# Patient Record
Sex: Female | Born: 1990 | Hispanic: Yes | Marital: Married | Smoking: Never smoker
Health system: Southern US, Community
[De-identification: ages and names within clinical notes are randomized; demographics above are authoritative.]

## PROBLEM LIST (undated history)

## (undated) ENCOUNTER — Inpatient Hospital Stay (HOSPITAL_COMMUNITY): Payer: Self-pay

## (undated) DIAGNOSIS — Z8619 Personal history of other infectious and parasitic diseases: Secondary | ICD-10-CM

## (undated) HISTORY — PX: NO PAST SURGERIES: SHX2092

## (undated) HISTORY — DX: Personal history of other infectious and parasitic diseases: Z86.19

---
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
## 2013-11-22 ENCOUNTER — Encounter (HOSPITAL_COMMUNITY): Payer: Self-pay | Admitting: *Deleted

## 2015-01-22 NOTE — L&D Delivery Note (Signed)
Delivery Note At 11:40 AM a viable female was delivered via Vaginal, Spontaneous Delivery (Presentation:OA ;restituted to ROA  ).  APGAR: 9, 9; weight pending  .   Placenta status:intact ,delivered via Tomasa BlaseSchultz .  Cord: three vessels  with the following complications:none.  Cord pH:N/A  Anesthesia: epidural  Episiotomy: None Lacerations: None Suture Repair: N/A Est. Blood Loss (mL): 100  Mom to postpartum.  Baby to Couplet care / Skin to Skin.  Clayton BiblesSamantha Weinhold, SNM 01/05/2016, 11:57 AM  Patient is a [id number] at 5515w5d who was admitted w/ SROM/labor, uncomplicated prenatal course.  She progressed without augmentation.  I was gloved and present for delivery in its entirety.  Second stage of labor progressed, baby delivered after less than 30 mins of contractions.  Mild decels during second stage noted.  Complications: none  Lacerations: none  EBL: 100cc  SHAW, KIMBERLY, CNM 12:19 PM  01/05/2016

## 2015-04-23 ENCOUNTER — Encounter (HOSPITAL_COMMUNITY): Payer: Self-pay

## 2015-04-23 ENCOUNTER — Emergency Department (HOSPITAL_COMMUNITY)
Admission: EM | Admit: 2015-04-23 | Discharge: 2015-04-23 | Disposition: A | Discharge status: Home / Self Care | Payer: Medicaid Other | Attending: Emergency Medicine | Admitting: Emergency Medicine

## 2015-04-23 DIAGNOSIS — B9789 Other viral agents as the cause of diseases classified elsewhere: Secondary | ICD-10-CM

## 2015-04-23 DIAGNOSIS — J069 Acute upper respiratory infection, unspecified: Secondary | ICD-10-CM | POA: Insufficient documentation

## 2015-04-23 MED ORDER — GUAIFENESIN 100 MG/5ML PO LIQD: 100.0000 mg | ORAL | Status: DC | PRN

## 2015-04-23 MED ORDER — BENZONATATE 100 MG PO CAPS: 100.0000 mg | ORAL_CAPSULE | Freq: Three times a day (TID) | ORAL | Status: DC

## 2015-04-23 NOTE — Discharge Instructions (Signed)
Viral Infections °A viral infection can be caused by different types of viruses. Most viral infections are not serious and resolve on their own. However, some infections may cause severe symptoms and may lead to further complications. °SYMPTOMS °Viruses can frequently cause: °· Minor sore throat. °· Aches and pains. °· Headaches. °· Runny nose. °· Different types of rashes. °· Watery eyes. °· Tiredness. °· Cough. °· Loss of appetite. °· Gastrointestinal infections, resulting in nausea, vomiting, and diarrhea. °These symptoms do not respond to antibiotics because the infection is not caused by bacteria. However, you might catch a bacterial infection following the viral infection. This is sometimes called a "superinfection." Symptoms of such a bacterial infection may include: °· Worsening sore throat with pus and difficulty swallowing. °· Swollen neck glands. °· Chills and a high or persistent fever. °· Severe headache. °· Tenderness over the sinuses. °· Persistent overall ill feeling (malaise), muscle aches, and tiredness (fatigue). °· Persistent cough. °· Yellow, green, or brown mucus production with coughing. °HOME CARE INSTRUCTIONS  °· Only take over-the-counter or prescription medicines for pain, discomfort, diarrhea, or fever as directed by your caregiver. °· Drink enough water and fluids to keep your urine clear or pale yellow. Sports drinks can provide valuable electrolytes, sugars, and hydration. °· Get plenty of rest and maintain proper nutrition. Soups and broths with crackers or rice are fine. °SEEK IMMEDIATE MEDICAL CARE IF:  °· You have severe headaches, shortness of breath, chest pain, neck pain, or an unusual rash. °· You have uncontrolled vomiting, diarrhea, or you are unable to keep down fluids. °· You or your child has an oral temperature above 102° F (38.9° C), not controlled by medicine. °· Your baby is older than 3 months with a rectal temperature of 102° F (38.9° C) or higher. °· Your baby is 3  months old or younger with a rectal temperature of 100.4° F (38° C) or higher. °MAKE SURE YOU:  °· Understand these instructions. °· Will watch your condition. °· Will get help right away if you are not doing well or get worse. °  °This information is not intended to replace advice given to you by your health care provider. Make sure you discuss any questions you have with your health care provider. °  °Document Released: 10/17/2004 Document Revised: 04/01/2011 Document Reviewed: 06/15/2014 °Elsevier Interactive Patient Education ©2016 [employer] ° °

## 2015-04-23 NOTE — ED Provider Notes (Signed)
CSN: [id number]     Arrival date & time 04/23/15  1225 History   First MD Initiated Contact with Patient 04/23/15 1320     Chief Complaint  Patient presents with  . Cough     (Consider location/radiation/quality/duration/timing/severity/associated sxs/prior Treatment) HPI   25 year old female presents with cold symptoms. Patient report for the past 2 weeks she has had fever, chills, headache, nasal congestion, popping sounds in the ears, runny nose, sore throat, dry cough, decrease in appetite. Although this symptom is improving, her cough still persist. Her son is now having this same cough as well. She did not have a flu shot and pneumonia shot this year. She is a nonsmoker and no history of COPD or asthma.  Past Medical History  Diagnosis Date  . Preterm labor   . Chlamydia infection, current pregnancy     partner not treated   Past Surgical History  Procedure Laterality Date  . No past surgeries     Family History  Problem Relation Age of Onset  . Asthma Sister    Social History  Substance Use Topics  . Smoking status: Never Smoker   . Smokeless tobacco: Never Used  . Alcohol Use: No   OB History    Gravida Para Term Preterm AB TAB SAB Ectopic Multiple Living   0 0 0 0 0 1     Review of Systems  All other systems reviewed and are negative.     Allergies  Review of patient's allergies indicates no known allergies.  Home Medications   Prior to Admission medications   Not on File   BP 113/74 mmHg  Pulse 94  Temp(Src) 98 F (36.7 C) (Oral)  Resp 16  Wt 56.155 kg  SpO2 97%  LMP 04/16/2015 Physical Exam  Constitutional: She is oriented to person, place, and time. She appears well-developed and well-nourished. No distress.  Hispanic female, well appearing in no acute distress. She has persistent cough.  HENT:  Head: Atraumatic.  Ears: TMs are dull but non erythematous and no signs of perforation Nose: Rhinorrhea Throat: Uvula is midline no  tonsillar enlargement or exudates, no trismus  Eyes: Conjunctivae are normal.  Neck: Normal range of motion. Neck supple.  No nuchal rigidity  Cardiovascular: Normal rate and regular rhythm.   Pulmonary/Chest: Effort normal and breath sounds normal. No respiratory distress. She has no wheezes. She has no rales. She exhibits no tenderness.  Abdominal: Soft. There is no tenderness.  Lymphadenopathy:    She has no cervical adenopathy.  Neurological: She is alert and oriented to person, place, and time.  Skin: No rash noted.  Psychiatric: She has a normal mood and affect.  Nursing note and vitals reviewed.   ED Course  Procedures (including critical care time) Labs Review Labs Reviewed - No data to display  Imaging Review No results found. I have personally reviewed and evaluated these images and lab results as part of my medical decision-making.   EKG Interpretation None      MDM   Final diagnoses:  Viral upper respiratory tract infection with cough    BP 113/74 mmHg  Pulse 94  Temp(Src) 98 F (36.7 C) (Oral)  Resp 16  Wt 56.155 kg  SpO2 97%  LMP 04/16/2015   1:31 PM Patient symptoms consistence with moderate risk for infection. Her lungs are clear on exam. No hypoxia. She is afebrile. Low suspicion for pneumonia. Symptomatic treatment provided. Return precaution discussed.   Fayrene Helper, PA-C  04/23/15 1333  Richardean Canalavid H Yao, MD 04/23/15 1655

## 2015-04-23 NOTE — ED Notes (Signed)
Pt here with c/o dry cough, onset one week ago associated with "popping sound" when she blows her nose. She also reports runny nose.

## 2015-06-05 ENCOUNTER — Ambulatory Visit (INDEPENDENT_AMBULATORY_CARE_PROVIDER_SITE_OTHER): Payer: Medicaid Other | Admitting: *Deleted

## 2015-06-05 DIAGNOSIS — N898 Other specified noninflammatory disorders of vagina: Secondary | ICD-10-CM

## 2015-06-05 DIAGNOSIS — Z3201 Encounter for pregnancy test, result positive: Secondary | ICD-10-CM

## 2015-06-05 DIAGNOSIS — O26891 Other specified pregnancy related conditions, first trimester: Secondary | ICD-10-CM

## 2015-06-05 DIAGNOSIS — Z113 Encounter for screening for infections with a predominantly sexual mode of transmission: Secondary | ICD-10-CM

## 2015-06-05 DIAGNOSIS — Z3200 Encounter for pregnancy test, result unknown: Secondary | ICD-10-CM

## 2015-06-05 LAB — POCT PREGNANCY, URINE: PREG TEST UR: POSITIVE — AB

## 2015-06-05 MED ORDER — TERCONAZOLE 0.4 % VA CREA: 1.0000 | TOPICAL_CREAM | Freq: Every day | VAGINAL | Status: DC

## 2015-06-05 NOTE — Progress Notes (Signed)
Patient here for pregnancy test. States has regular periods- LMP 04/16/15 which makes her 3523w1d. Would like to get prenatal care here- wants to get blood work at first visit.  Also states has been having vaginal itching and white or yellow discharge- took vagisil with little relief. Wants medicine for yeast which I have sent to pharmacy. Also states Was recently treated for chlamydia but doesn't think partner has been treated. Will send urine for testing for gc/chlamydia. I explained it cream does not help vaginal iching- and if  Gc/chlamydia are negative - but still having itching or discomfort that worsens before initial ob visit- call for appt. She voices understanding.

## 2015-06-06 LAB — GC/CHLAMYDIA PROBE AMP (~~LOC~~) NOT AT ARMC
CHLAMYDIA, DNA PROBE: NEGATIVE
NEISSERIA GONORRHEA: NEGATIVE

## 2015-07-06 ENCOUNTER — Encounter: Payer: Self-pay | Admitting: Obstetrics and Gynecology

## 2015-07-27 ENCOUNTER — Encounter: Payer: Self-pay | Admitting: Family Medicine

## 2015-08-15 ENCOUNTER — Other Ambulatory Visit (HOSPITAL_COMMUNITY): Payer: Self-pay | Admitting: Student

## 2015-08-15 ENCOUNTER — Encounter: Payer: Self-pay | Admitting: Student

## 2015-08-15 ENCOUNTER — Ambulatory Visit (INDEPENDENT_AMBULATORY_CARE_PROVIDER_SITE_OTHER): Payer: Self-pay | Admitting: Student

## 2015-08-15 VITALS — BP 110/60 | HR 74 | Wt 134.0 lb

## 2015-08-15 DIAGNOSIS — Z8751 Personal history of pre-term labor: Secondary | ICD-10-CM

## 2015-08-15 DIAGNOSIS — O0993 Supervision of high risk pregnancy, unspecified, third trimester: Secondary | ICD-10-CM | POA: Insufficient documentation

## 2015-08-15 DIAGNOSIS — Z113 Encounter for screening for infections with a predominantly sexual mode of transmission: Secondary | ICD-10-CM

## 2015-08-15 DIAGNOSIS — Z3482 Encounter for supervision of other normal pregnancy, second trimester: Secondary | ICD-10-CM

## 2015-08-15 DIAGNOSIS — Z363 Encounter for antenatal screening for malformations: Secondary | ICD-10-CM

## 2015-08-15 DIAGNOSIS — Z3492 Encounter for supervision of normal pregnancy, unspecified, second trimester: Secondary | ICD-10-CM

## 2015-08-15 NOTE — Patient Instructions (Signed)

## 2015-08-16 LAB — PRENATAL PROFILE (SOLSTAS)
Antibody Screen: NEGATIVE
Basophils Absolute: 0 cells/uL (ref 0–200)
Basophils Relative: 0 %
Eosinophils Absolute: 0 cells/uL — ABNORMAL LOW (ref 15–500)
Eosinophils Relative: 0 %
HEMATOCRIT: 37.9 % (ref 35.0–45.0)
HEMOGLOBIN: 13.1 g/dL (ref 11.7–15.5)
HEP B S AG: NEGATIVE
HIV: NONREACTIVE
LYMPHS ABS: 2210 {cells}/uL (ref 850–3900)
Lymphocytes Relative: 26 %
MCH: 28.7 pg (ref 27.0–33.0)
MCHC: 34.6 g/dL (ref 32.0–36.0)
MCV: 83.1 fL (ref 80.0–100.0)
MONO ABS: 425 {cells}/uL (ref 200–950)
MPV: 10.8 fL (ref 7.5–12.5)
Monocytes Relative: 5 %
NEUTROS ABS: 5865 {cells}/uL (ref 1500–7800)
NEUTROS PCT: 69 %
Platelets: 231 10*3/uL (ref 140–400)
RBC: 4.56 MIL/uL (ref 3.80–5.10)
RDW: 14.5 % (ref 11.0–15.0)
Rh Type: POSITIVE
Rubella: 1.09 Index — ABNORMAL HIGH (ref <0.90)
WBC: 8.5 10*3/uL (ref 3.8–10.8)

## 2015-08-16 LAB — WET PREP, GENITAL: Trich, Wet Prep: NONE SEEN

## 2015-08-16 NOTE — Progress Notes (Signed)
  Subjective:    Lorraine Shelton is a [id number] [redacted]w[redacted]d being seen today for her first obstetrical visit.  Her obstetrical history is significant for preterm delivery, first baby born at 54 weeks (subsequent pregnancy TSVD). Patient does intend to breast feed. Pregnancy history fully reviewed.  Patient reports no complaints.  Vitals:   08/15/15 1302  BP: 110/60  Pulse: 74  Weight: 134 lb (60.8 kg)    HISTORY: OB History  Gravida Para Term Preterm AB Living  3 2 1 1  0 2  SAB TAB Ectopic Multiple Live Births  0 0 0 0 2    # Outcome Date GA Lbr Len/2nd Weight Sex Delivery Anes PTL Lv  3 Current           2 Term 12/04/10 [redacted]w[redacted]d 04:58 / 00:17 7 lb 1.8 oz (3.226 kg) M Vag-Spont EPI       Birth Comments: WNL  1 Preterm 07/05/09 [redacted]w[redacted]d  4 lb (1.814 kg) M Vag-Spont EPI N LIV     Birth Comments: PProm     Past Medical History:  Diagnosis Date  . History of chlamydia 2012  . Preterm labor    Past Surgical History:  Procedure Laterality Date  . NO PAST SURGERIES     Family History  Problem Relation Age of Onset  . Asthma Brother      Exam    Uterus:  Fundal Height: 17 cm  Pelvic Exam:    Perineum: No Hemorrhoids, Normal Perineum   Vulva: normal   Vagina:  normal mucosa, normal discharge   Cervix: no cervical motion tenderness and cervix closed   Adnexa: normal adnexa   Skin: normal coloration and turgor, no rashes    Neurologic: oriented, normal mood   Extremities: normal strength, tone, and muscle mass   HEENT neck supple with midline trachea and thyroid without masses   Mouth/Teeth dental hygiene good   Neck supple and no masses   Cardiovascular: regular rate and rhythm, no murmurs or gallops   Respiratory:  appears well, vitals normal, no respiratory distress, acyanotic, normal RR, chest clear, no wheezing, crepitations, rhonchi, normal symmetric air entry   Abdomen: soft, non-tender; bowel sounds normal; no masses,  no organomegaly      Assessment:    Pregnancy:  [id number] Patient Active Problem List   Diagnosis Date Noted  . Supervision of normal pregnancy in second trimester 08/15/2015  . Heart murmur previously undiagnosed 09/05/2010  . History of preterm labor 09/05/2010        Plan:  1. Supervision of normal pregnancy in second trimester  - Prenatal Profile - Pain Mgmt, Profile 6 Conf w/o mM, U - Hemoglobinopathy evaluation - Culture, OB Urine - [place] MFM OB COMP + 14 WK; Future - GC/Chlamydia probe amp ([place])not at [facility] - Wet prep, genital  2. History of preterm labor -Discussed risks & benefits of 17-P, pt undecided  3. Encounter for antenatal screening for malformation using ultrasound - [place] MFM OB COMP + 14 WK; Future    Initial labs drawn. Prenatal vitamins. Problem list reviewed and updated. Genetic Screening discussed Quad Screen: declined.  Ultrasound discussed; fetal survey: ordered.  Follow up in 4 weeks.   Judeth Horn 08/16/2015

## 2015-08-17 LAB — GC/CHLAMYDIA PROBE AMP (~~LOC~~) NOT AT ARMC
Chlamydia: NEGATIVE
NEISSERIA GONORRHEA: NEGATIVE

## 2015-08-17 LAB — CULTURE, OB URINE
Colony Count: NO GROWTH
ORGANISM ID, BACTERIA: NO GROWTH

## 2015-08-21 ENCOUNTER — Encounter (HOSPITAL_COMMUNITY): Payer: Self-pay | Admitting: Student

## 2015-08-24 LAB — PAIN MGMT, PROFILE 6 CONF W/O MM, U
6 ACETYLMORPHINE: NEGATIVE ng/mL (ref <10)
ALPHAHYDROXYALPRAZOLAM: NEGATIVE ng/mL (ref <25)
AMINOCLONAZEPAM: NEGATIVE ng/mL (ref <25)
AMPHETAMINES: NEGATIVE ng/mL (ref <500)
Alcohol Metabolites: NEGATIVE ng/mL (ref <500)
Alphahydroxymidazolam: NEGATIVE ng/mL (ref <50)
Alphahydroxytriazolam: NEGATIVE ng/mL (ref <50)
BENZODIAZEPINES: POSITIVE ng/mL — AB (ref <100)
Barbiturates: NEGATIVE ng/mL (ref <300)
CREATININE: 46 mg/dL (ref >=20.0)
Cocaine Metabolite: NEGATIVE ng/mL (ref <150)
HYDROXYETHYLFLURAZEPAM: NEGATIVE ng/mL (ref <50)
Lorazepam: >5000 ng/mL — ABNORMAL HIGH (ref <50)
METHADONE METABOLITE: NEGATIVE ng/mL (ref <100)
Marijuana Metabolite: NEGATIVE ng/mL (ref <20)
Nordiazepam: NEGATIVE ng/mL (ref <50)
OPIATES: NEGATIVE ng/mL (ref <100)
OXAZEPAM: NEGATIVE ng/mL (ref <50)
OXIDANT: NEGATIVE ug/mL (ref <200)
OXYCODONE: NEGATIVE ng/mL (ref <100)
PH: 7.59 (ref 4.5–9.0)
PHENCYCLIDINE: NEGATIVE ng/mL (ref <25)
Please note:: 0
Temazepam: NEGATIVE ng/mL (ref <50)

## 2015-08-28 ENCOUNTER — Other Ambulatory Visit: Payer: Self-pay | Admitting: Student

## 2015-08-28 ENCOUNTER — Ambulatory Visit (HOSPITAL_COMMUNITY)
Admission: RE | Admit: 2015-08-28 | Discharge: 2015-08-28 | Disposition: A | Discharge status: Home / Self Care | Payer: Self-pay | Source: Ambulatory Visit | Attending: Student | Admitting: Student

## 2015-08-28 DIAGNOSIS — Z3A19 19 weeks gestation of pregnancy: Secondary | ICD-10-CM | POA: Insufficient documentation

## 2015-08-28 DIAGNOSIS — O09212 Supervision of pregnancy with history of pre-term labor, second trimester: Secondary | ICD-10-CM | POA: Insufficient documentation

## 2015-08-28 DIAGNOSIS — Z363 Encounter for antenatal screening for malformations: Secondary | ICD-10-CM

## 2015-08-28 DIAGNOSIS — Z3492 Encounter for supervision of normal pregnancy, unspecified, second trimester: Secondary | ICD-10-CM

## 2015-09-13 ENCOUNTER — Encounter: Payer: Self-pay | Admitting: Obstetrics & Gynecology

## 2015-09-13 ENCOUNTER — Encounter: Payer: Self-pay | Admitting: Obstetrics and Gynecology

## 2015-11-21 ENCOUNTER — Ambulatory Visit (INDEPENDENT_AMBULATORY_CARE_PROVIDER_SITE_OTHER): Payer: Self-pay | Admitting: Advanced Practice Midwife

## 2015-11-21 ENCOUNTER — Ambulatory Visit (INDEPENDENT_AMBULATORY_CARE_PROVIDER_SITE_OTHER): Payer: Self-pay | Admitting: Clinical

## 2015-11-21 VITALS — BP 112/66 | HR 96 | Wt 147.0 lb

## 2015-11-21 DIAGNOSIS — F4323 Adjustment disorder with mixed anxiety and depressed mood: Secondary | ICD-10-CM

## 2015-11-21 DIAGNOSIS — O0930 Supervision of pregnancy with insufficient antenatal care, unspecified trimester: Secondary | ICD-10-CM | POA: Insufficient documentation

## 2015-11-21 DIAGNOSIS — O0933 Supervision of pregnancy with insufficient antenatal care, third trimester: Secondary | ICD-10-CM

## 2015-11-21 DIAGNOSIS — O0992 Supervision of high risk pregnancy, unspecified, second trimester: Secondary | ICD-10-CM

## 2015-11-21 DIAGNOSIS — Z23 Encounter for immunization: Secondary | ICD-10-CM

## 2015-11-21 DIAGNOSIS — Z113 Encounter for screening for infections with a predominantly sexual mode of transmission: Secondary | ICD-10-CM

## 2015-11-21 DIAGNOSIS — O09899 Supervision of other high risk pregnancies, unspecified trimester: Secondary | ICD-10-CM

## 2015-11-21 DIAGNOSIS — Z3482 Encounter for supervision of other normal pregnancy, second trimester: Secondary | ICD-10-CM

## 2015-11-21 DIAGNOSIS — Z8751 Personal history of pre-term labor: Secondary | ICD-10-CM

## 2015-11-21 DIAGNOSIS — O9213 Cracked nipple associated with lactation: Secondary | ICD-10-CM

## 2015-11-21 DIAGNOSIS — O09219 Supervision of pregnancy with history of pre-term labor, unspecified trimester: Secondary | ICD-10-CM

## 2015-11-21 LAB — CBC
HEMATOCRIT: 34.6 % — AB (ref 35.0–45.0)
Hemoglobin: 11.6 g/dL — ABNORMAL LOW (ref 11.7–15.5)
MCH: 27.6 pg (ref 27.0–33.0)
MCHC: 33.5 g/dL (ref 32.0–36.0)
MCV: 82.2 fL (ref 80.0–100.0)
MPV: 10.7 fL (ref 7.5–12.5)
Platelets: 207 10*3/uL (ref 140–400)
RBC: 4.21 MIL/uL (ref 3.80–5.10)
RDW: 13.6 % (ref 11.0–15.0)
WBC: 10.5 10*3/uL (ref 3.8–10.8)

## 2015-11-21 MED ORDER — TETANUS-DIPHTH-ACELL PERTUSSIS 5-2.5-18.5 LF-MCG/0.5 IM SUSP
0.5000 mL | Freq: Once | INTRAMUSCULAR | Status: AC
  Administered 2015-11-21: 0.5 mL via INTRAMUSCULAR

## 2015-11-21 NOTE — BH Specialist Note (Signed)
Session Start time: 9:05  End Time: 9:30 Total Time:  25 minutes Type of Service: Behavioral Health - Individual/Family Interpreter: No.   Interpreter Name & Language: n/a # [facility]   SUBJECTIVE: Lorraine Shelton is a 25 y.o. female  Pt. was referred by Dorathy KinsmanVirginia Smith, CNM for:  anxiety and depression. Pt. reports the following symptoms/concerns: Pt states that she is feeling stressed and irritated this pregnancy, as she is caring for her own two children, as well as her sister's 4 children, while preparing for new baby. Pt copes by going out with friends and going to fitness classes. 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  Pt may benefit from psychoeducation and brief therapeutic intervention regarding coping with symptoms of anxious and depressed mood.      PLAN: 1. F/U with behavioral health clinician: One month, or as needed 2. Behavioral Health meds: none 3. Behavioral recommendations:  -Practice daily CALM relaxation breathing exercise  -Take prenatal vitamin  daily -Read educational material regarding coping with symptoms of anxiety and depression -Consider questions in Postpartum Planner, to prepare for postpartum with baby 4. Referral: Brief Counseling/Psychotherapy and Psychoeducation   Woc-Behavioral Health Clinician  Behavioral Health Clinician  Marlon PelWarmhandoff:   Warm Hand Off Completed.        Dep[facility] screen [facility] 11/21/2015 08/17/2015  Decreased Interest 2 0  Down, Depressed, Hopeless 2 0  PHQ - 2 Score 4 0  Altered sleeping 3 0  Tired, decreased energy 3 1  Change in appetite 3 1  Feeling bad or failure about yourself  2 0  Trouble concentrating 1 0  Moving slowly or fidgety/restless 0 0  Suicidal thoughts 0 0  PHQ-9 Score 16 2   GAD 7 : Generalized Anxiety Score 11/21/2015 08/17/2015  Nervous, Anxious, on Edge 1 1  Control/stop worrying 1 0  Worry too much - different things 2 0  Trouble relaxing 2 0  Restless 3 1  Easily annoyed or irritable 3 1  Afraid - awful might happen 2 0  Total GAD 7 Score 14 3

## 2015-11-21 NOTE — Patient Instructions (Signed)
Pregnancy and Influenza Influenza, also called the flu, is an infection of the respiratory tract. If you are pregnant, you are more likely to catch the flu. You are also more likely to have a more serious case of the flu. This is because pregnancy lowers your body's ability to fight off infections (it weakens your immune system). It also puts additional stress on your heart and lungs, which makes you more likely to have complications. Having a bad case of the flu, especially with a high fever, can be dangerous for your developing baby. It can cause you to go into early labor. HOW DO PEOPLE GET THE FLU? The flu is caused by the influenza virus. This virus is common every year in the fall and winter. It spreads when virus particles get passed from person to person. You can get the virus if you are near a sick person who is coughing or sneezing. You can also get the virus if you touch something that has the virus on it and then touch your face. HOW CAN I PROTECT MYSELF AGAINST THE FLU?  Get a flu shot. The best way to prevent the flu is to get a flu shot before flu season starts. The flu shot is not dangerous for your developing baby. It may even help protect your baby from the flu for up to 6 months after birth. The flu shot is one type of flu vaccine. Another type is a nasal spray vaccine. Do not get the nasal spray vaccine. It is not approved for pregnancy.  Do not come in close contact with sick people.  Do not share food, drinks, or utensils with other people.  Wash your hands often. Use hand sanitizer when soap and water are not available. WHAT SHOULD I DO IF I HAVE FLU SYMPTOMS? If you have any flu symptoms, call your health care provider right away. Flu symptoms include:  Fever or chills.  Muscle aches.  Headache.  Sore throat.  Nasal congestion.  Cough.  Feeling tired.  Loss of appetite.  Vomiting.  Diarrhea. You may be able to take an antiviral medicine to keep the flu from  becoming severe and to shorten how long it lasts. WHAT SHOULD I DO AT HOME IF I AM DIAGNOSED WITH THE FLU?  Do not take any medicine, including cold or flu medicine, unless directed by your health care provider.  If you take antiviral medicine, make sure you finish it even if you start to feel better.  Drink enough fluid to keep your urine clear or pale yellow.  Get plenty of rest. WHEN WOULD I SEEK IMMEDIATE MEDICAL CARE IF I HAVE THE FLU?  You have trouble breathing.  You have chest pain.  You begin to have labor pains.  You have a high fever that does not go down after you take medicine.  You do not feel your baby move.  You have diarrhea or vomiting that will not go away.   This information is not intended to replace advice given to you by your health care provider. Make sure you discuss any questions you have with your health care provider.   Document Released: 11/10/2007 Document Revised: 01/12/2013 Document Reviewed: 12/04/2012 Elsevier Interactive Patient Education 2016 [employer].  Tdap Vaccine (Tetanus, Diphtheria and Pertussis): What You Need to Know 1. Why get vaccinated? Tetanus, diphtheria and pertussis are very serious diseases. Tdap vaccine can protect [place] from these diseases. And, Tdap vaccine given to pregnant women can protect newborn babies against pertussis.  TETANUS (Lockjaw) is rare in the [place] today. It causes painful muscle tightening and stiffness, usually all over the body.  It can lead to tightening of muscles in the head and neck so you can't open your mouth, swallow, or sometimes even breathe. Tetanus kills about 1 out of 10 people who are infected even after receiving the best medical care. DIPHTHERIA is also rare in the [place] today. It can cause a thick coating to form in the back of the throat.  It can lead to breathing problems, heart failure, paralysis, and death. PERTUSSIS (Whooping Cough) causes severe coughing spells, which  can cause difficulty breathing, vomiting and disturbed sleep.  It can also lead to weight loss, incontinence, and rib fractures. Up to 2 in 100 adolescents and 5 in 100 adults with pertussis are hospitalized or have complications, which could include pneumonia or death. These diseases are caused by bacteria. Diphtheria and pertussis are spread from person to person through secretions from coughing or sneezing. Tetanus enters the body through cuts, scratches, or wounds. Before vaccines, as many as 200,000 cases of diphtheria, 200,000 cases of pertussis, and hundreds of cases of tetanus, were reported in the [place] each year. Since vaccination began, reports of cases for tetanus and diphtheria have dropped by about 99% and for pertussis by about 80%. 2. Tdap vaccine Tdap vaccine can protect adolescents and adults from tetanus, diphtheria, and pertussis. One dose of Tdap is routinely given at age 34 or 28. People who did not get Tdap at that age should get it as soon as possible. Tdap is especially important for healthcare professionals and anyone having close contact with a baby younger than 12 months. Pregnant women should get a dose of Tdap during every pregnancy, to protect the newborn from pertussis. Infants are most at risk for severe, life-threatening complications from pertussis. Another vaccine, called Td, protects against tetanus and diphtheria, but not pertussis. A Td booster should be given every 10 years. Tdap may be given as one of these boosters if you have never gotten Tdap before. Tdap may also be given after a severe cut or burn to prevent tetanus infection. Your doctor or the person giving you the vaccine can give you more information. Tdap may safely be given at the same time as other vaccines. 3. Some people should not get this vaccine  A person who has ever had a life-threatening allergic reaction after a previous dose of any diphtheria, tetanus or pertussis containing  vaccine, OR has a severe allergy to any part of this vaccine, should not get Tdap vaccine. Tell the person giving the vaccine about any severe allergies.  Anyone who had coma or long repeated seizures within 7 days after a childhood dose of DTP or DTaP, or a previous dose of Tdap, should not get Tdap, unless a cause other than the vaccine was found. They can still get Td.  Talk to your doctor if you:  have seizures or another nervous system problem,  had severe pain or swelling after any vaccine containing diphtheria, tetanus or pertussis,  ever had a condition called Guillain-Barr Syndrome (GBS),  aren't feeling well on the day the shot is scheduled. 4. Risks With any medicine, including vaccines, there is a chance of side effects. These are usually mild and go away on their own. Serious reactions are also possible but are rare. Most people who get Tdap vaccine do not have any problems with it. Mild problems following Tdap (Did not interfere  with activities)  Pain where the shot was given (about 3 in 4 adolescents or 2 in 3 adults)  Redness or swelling where the shot was given (about 1 person in 5)  Mild fever of at least 100.68F (up to about 1 in 25 adolescents or 1 in 100 adults)  Headache (about 3 or 4 people in 10)  Tiredness (about 1 person in 3 or 4)  Nausea, vomiting, diarrhea, stomach ache (up to 1 in 4 adolescents or 1 in 10 adults)  Chills, sore joints (about 1 person in 10)  Body aches (about 1 person in 3 or 4)  Rash, swollen glands (uncommon) Moderate problems following Tdap (Interfered with activities, but did not require medical attention)  Pain where the shot was given (up to 1 in 5 or 6)  Redness or swelling where the shot was given (up to about 1 in 16 adolescents or 1 in 12 adults)  Fever over 102F (about 1 in 100 adolescents or 1 in 250 adults)  Headache (about 1 in 7 adolescents or 1 in 10 adults)  Nausea, vomiting, diarrhea, stomach ache (up to  1 or 3 people in 100)  Swelling of the entire arm where the shot was given (up to about 1 in 500). Severe problems following Tdap (Unable to perform usual activities; required medical attention)  Swelling, severe pain, bleeding and redness in the arm where the shot was given (rare). Problems that could happen after any vaccine:  People sometimes faint after a medical procedure, including vaccination. Sitting or lying down for about 15 minutes can help prevent fainting, and injuries caused by a fall. Tell your doctor if you feel dizzy, or have vision changes or ringing in the ears.  Some people get severe pain in the shoulder and have difficulty moving the arm where a shot was given. This happens very rarely.  Any medication can cause a severe allergic reaction. Such reactions from a vaccine are very rare, estimated at fewer than 1 in a million doses, and would happen within a few minutes to a few hours after the vaccination. As with any medicine, there is a very remote chance of a vaccine causing a serious injury or death. The safety of vaccines is always being monitored. For more information, visit: http://floyd.org/ 5. What if there is a serious problem? What should I look for?  Look for anything that concerns you, such as signs of a severe allergic reaction, very high fever, or unusual behavior.  Signs of a severe allergic reaction can include hives, swelling of the face and throat, difficulty breathing, a fast heartbeat, dizziness, and weakness. These would usually start a few minutes to a few hours after the vaccination. What should I do?  If you think it is a severe allergic reaction or other emergency that can't wait, call 9-1-1 or get the person to the nearest hospital. Otherwise, call your doctor.  Afterward, the reaction should be reported to the Vaccine Adverse Event Reporting System (VAERS). Your doctor might file this report, or you can do it yourself through the VAERS  web site at www.vaers.LAgents.no, or by calling 1-252 797 3349. VAERS does not give medical advice.  6. The National Vaccine Injury Compensation Program The [employer] Vaccine Injury Compensation Program (VICP) is a federal program that was created to compensate people who may have been injured by certain vaccines. Persons who believe they may have been injured by a vaccine can learn about the program and about filing a claim by calling 1-9361174962  or visiting the VICP website at SpiritualWord.atwww.hrsa.gov/vaccinecompensation. There is a time limit to file a claim for compensation. 7. How can I learn more?  Ask your doctor. He or she can give you the vaccine package insert or suggest other sources of information.  Call your local or state health department.  Contact the Centers for Disease Control and Prevention (CDC):  Call (763)167-24231-(707)587-8211 (1-800-CDC-INFO) or  Visit CDC's website at PicCapture.uywww.cdc.gov/vaccines CDC Tdap Vaccine VIS (03/16/13)   This information is not intended to replace advice given to you by your health care provider. Make sure you discuss any questions you have with your health care provider.   Document Released: 07/09/2011 Document Revised: 01/28/2014 Document Reviewed: 04/21/2013 Elsevier Interactive Patient Education 2016 Elsevier Inc.  Ball CorporationBraxton Hicks Contractions Contractions of the uterus can occur throughout pregnancy. Contractions are not always a sign that you are in labor.  WHAT ARE BRAXTON HICKS CONTRACTIONS?  Contractions that occur before labor are called Braxton Hicks contractions, or false labor. Toward the end of pregnancy (32-34 weeks), these contractions can develop more often and may become more forceful. This is not true labor because these contractions do not result in opening (dilatation) and thinning of the cervix. They are sometimes difficult to tell apart from true labor because these contractions can be forceful and people have different pain tolerances. You should not  feel embarrassed if you go to the hospital with false labor. Sometimes, the only way to tell if you are in true labor is for your health care provider to look for changes in the cervix. If there are no prenatal problems or other health problems associated with the pregnancy, it is completely safe to be sent home with false labor and await the onset of true labor. HOW CAN YOU TELL THE DIFFERENCE BETWEEN TRUE AND FALSE LABOR? False Labor  The contractions of false labor are usually shorter and not as hard as those of true labor.   The contractions are usually irregular.   The contractions are often felt in the front of the lower abdomen and in the groin.   The contractions may go away when you walk around or change positions while lying down.   The contractions get weaker and are shorter lasting as time goes on.   The contractions do not usually become progressively stronger, regular, and closer together as with true labor.  True Labor  Contractions in true labor last 30-70 seconds, become very regular, usually become more intense, and increase in frequency.   The contractions do not go away with walking.   The discomfort is usually felt in the top of the uterus and spreads to the lower abdomen and low back.   True labor can be determined by your health care provider with an exam. This will show that the cervix is dilating and getting thinner.  WHAT TO REMEMBER  Keep up with your usual exercises and follow other instructions given by your health care provider.   Take medicines as directed by your health care provider.   Keep your regular prenatal appointments.   Eat and drink lightly if you think you are going into labor.   If Braxton Hicks contractions are making you uncomfortable:   Change your position from lying down or resting to walking, or from walking to resting.   Sit and rest in a tub of warm water.   Drink 2-3 glasses of water. Dehydration may cause  these contractions.   Do slow and deep breathing several times an  hour.  WHEN SHOULD I SEEK IMMEDIATE MEDICAL CARE? Seek immediate medical care if:  Your contractions become stronger, more regular, and closer together.   You have fluid leaking or gushing from your vagina.   You have a fever.   You pass blood-tinged mucus.   You have vaginal bleeding.   You have continuous abdominal pain.   You have low back pain that you never had before.   You feel your baby's head pushing down and causing pelvic pressure.   Your baby is not moving as much as it used to.    This information is not intended to replace advice given to you by your health care provider. Make sure you discuss any questions you have with your health care provider.   Document Released: 01/07/2005 Document Revised: 01/12/2013 Document Reviewed: 10/19/2012 Elsevier Interactive Patient Education [employer]2016 Elsevier Inc.

## 2015-11-21 NOTE — Progress Notes (Signed)
1 hr @ 924 Patient needs to see Asher MuirJamie

## 2015-11-21 NOTE — Progress Notes (Signed)
   PRENATAL VISIT NOTE  Subjective:  Lorraine Shelton is a 25 y.o. G3P1102 at 6951w2d being seen today for ongoing prenatal care.  Lapse in care since 17 weeks  2/2 transportation issues. Also under a lot of stress. She is currently monitored for the following issues for this high-risk pregnancy and has Heart murmur previously undiagnosed; History of preterm labor; History of preterm delivery, currently pregnant; and Supervision of normal pregnancy in second trimester on her problem list.  Patient reports vaginal irritation. Has taken Terazol and OTC yeast meds w/ temporary improvement. Contractions: Not present. Vag. Bleeding: None.  Movement: Present. Denies leaking of fluid.   The following portions of the patient's history were reviewed and updated as appropriate: allergies, current medications, past family history, past medical history, past social history, past surgical history and problem list. Problem list updated.  Objective:   Vitals:   11/21/15 0820  BP: 112/66  Pulse: 96  Weight: 147 lb (66.7 kg)    Fetal Status: Fetal Heart Rate (bpm): 133 Fundal Height: 32 cm Movement: Present     General:  Alert, oriented and cooperative. Patient is in no acute distress.  Skin: Skin is warm and dry. No rash noted.   Cardiovascular: Normal heart rate noted  Respiratory: Normal respiratory effort, no problems with respiration noted  Abdomen: Soft, gravid, appropriate for gestational age. Pain/Pressure: Present     Pelvic:  Cervical exam performed Dilation: Closed Effacement (%): 0 Station: Ballotable  Extremities: Normal range of motion.  Edema: None  Mental Status: Normal mood and affect. Normal behavior. Normal judgment and thought content.   Depression screening 16/14  Assessment and Plan:  Pregnancy: G3P1102 at 6851w2d  1. Encounter for supervision of other normal pregnancy in third trimester  - Wet prep, genital - GC/[facility]lam[facility] - Tdap (BOOSTRIX)  injection 0.5 mL; Inject 0.5 mLs into the muscle once. - Flu Vaccine QUAD 36+ mos IM (Fluarix, Quad PF) - CBC - RPR - HIV antibody (with reflex) - Glucose Tolerance, 1 HR (50g) w/o Fasting - US MFM OB FOLLOW UP; Future  2. History of preterm labor  - Missed opportunity to start 17-P.  - Wet prep, genital - GC/Chlamydia probe amp ([place] - Tdap (BOOSTRIX) injection 0.5 mL; Inject 0.5 mLs into the muscle once. - Flu Vaccine QUAD 36+ mos IM (Fluarix, Quad PF) - CBC - RPR - HIV antibody (with reflex) - Glucose Tolerance, 1 HR (50g) w/o Fasting - US MFM OB FOLLOW UP; Future  3. History of preterm delivery, currently pregnant  - Wet prep, genital - GC/Chlamydia probe amp ([place] - Tdap (BOOSTRIX) injection 0.5 mL; Inject 0.5 mLs into the muscle once. - Flu Vaccine QUAD 36+ mos IM (Fluarix, Quad PF) - CBC - RPR - HIV antibody (with reflex) - Glucose Tolerance, 1 HR (50g) w/o Fasting - US MFM OB FOLLOW UP; Future  Preterm labor symptoms and general obstetric precautions including but not limited to vaginal bleeding, contractions, leaking of fluid and fetal movement were reviewed in detail with the patient. Please refer to After Visit Summary for other counseling recommendations.  See Asher MuirJamie today. Encouraged pt to attend prenatal visits. States transportation problems have resolved.  F/U 2 weeks.   Dorathy KinsmanVirginia Sharelle Burditt, CNM

## 2015-11-22 ENCOUNTER — Other Ambulatory Visit: Payer: Self-pay | Admitting: Advanced Practice Midwife

## 2015-11-22 DIAGNOSIS — B373 Candidiasis of vulva and vagina: Secondary | ICD-10-CM

## 2015-11-22 DIAGNOSIS — B3731 Acute candidiasis of vulva and vagina: Secondary | ICD-10-CM

## 2015-11-22 DIAGNOSIS — B9689 Other specified bacterial agents as the cause of diseases classified elsewhere: Secondary | ICD-10-CM

## 2015-11-22 DIAGNOSIS — N760 Acute vaginitis: Principal | ICD-10-CM

## 2015-11-22 LAB — GC/CHLAMYDIA PROBE AMP (~~LOC~~) NOT AT ARMC
Chlamydia: NEGATIVE
Neisseria Gonorrhea: NEGATIVE

## 2015-11-22 LAB — WET PREP, GENITAL: TRICH WET PREP: NONE SEEN

## 2015-11-22 LAB — HIV ANTIBODY (ROUTINE TESTING W REFLEX): HIV 1&2 Ab, 4th Generation: NONREACTIVE

## 2015-11-22 LAB — RPR

## 2015-11-22 LAB — GLUCOSE TOLERANCE, 1 HOUR (50G) W/O FASTING: GLUCOSE, 1 HR, GESTATIONAL: 119 mg/dL (ref <140)

## 2015-11-22 MED ORDER — METRONIDAZOLE 500 MG PO TABS: 500.0000 mg | ORAL_TABLET | Freq: Two times a day (BID) | ORAL | 0 refills | Status: DC

## 2015-11-22 MED ORDER — TERCONAZOLE 0.4 % VA CREA: 1.0000 | TOPICAL_CREAM | Freq: Every day | VAGINAL | 0 refills | Status: DC

## 2015-11-22 NOTE — Progress Notes (Signed)
Wet prep shows yeast and BV.

## 2015-11-24 NOTE — Progress Notes (Signed)
Attempted to call patient regarding results line was busy x 2

## 2015-11-27 ENCOUNTER — Ambulatory Visit (HOSPITAL_COMMUNITY)
Admission: RE | Admit: 2015-11-27 | Discharge: 2015-11-27 | Disposition: A | Discharge status: Home / Self Care | Payer: Self-pay | Source: Ambulatory Visit | Attending: Advanced Practice Midwife | Admitting: Advanced Practice Midwife

## 2015-11-27 ENCOUNTER — Other Ambulatory Visit: Payer: Self-pay | Admitting: Advanced Practice Midwife

## 2015-11-27 DIAGNOSIS — Z3482 Encounter for supervision of other normal pregnancy, second trimester: Secondary | ICD-10-CM

## 2015-11-27 DIAGNOSIS — Z3A32 32 weeks gestation of pregnancy: Secondary | ICD-10-CM | POA: Insufficient documentation

## 2015-11-27 DIAGNOSIS — O09899 Supervision of other high risk pregnancies, unspecified trimester: Secondary | ICD-10-CM

## 2015-11-27 DIAGNOSIS — O09219 Supervision of pregnancy with history of pre-term labor, unspecified trimester: Secondary | ICD-10-CM

## 2015-11-27 DIAGNOSIS — Z362 Encounter for other antenatal screening follow-up: Secondary | ICD-10-CM

## 2015-11-27 DIAGNOSIS — Z8751 Personal history of pre-term labor: Secondary | ICD-10-CM

## 2015-11-27 DIAGNOSIS — O09213 Supervision of pregnancy with history of pre-term labor, third trimester: Secondary | ICD-10-CM | POA: Insufficient documentation

## 2015-11-30 ENCOUNTER — Encounter: Payer: Self-pay | Admitting: Advanced Practice Midwife

## 2015-11-30 ENCOUNTER — Inpatient Hospital Stay (HOSPITAL_COMMUNITY)
Admission: AD | Admit: 2015-11-30 | Discharge: 2015-11-30 | Disposition: A | Discharge status: Home / Self Care | Payer: Self-pay | Source: Ambulatory Visit | Attending: Obstetrics & Gynecology | Admitting: Obstetrics & Gynecology

## 2015-11-30 ENCOUNTER — Encounter (HOSPITAL_COMMUNITY): Payer: Self-pay

## 2015-11-30 DIAGNOSIS — Z3A32 32 weeks gestation of pregnancy: Secondary | ICD-10-CM

## 2015-11-30 DIAGNOSIS — IMO0001 Reserved for inherently not codable concepts without codable children: Secondary | ICD-10-CM | POA: Insufficient documentation

## 2015-11-30 DIAGNOSIS — O98813 Other maternal infectious and parasitic diseases complicating pregnancy, third trimester: Secondary | ICD-10-CM

## 2015-11-30 DIAGNOSIS — O0992 Supervision of high risk pregnancy, unspecified, second trimester: Secondary | ICD-10-CM

## 2015-11-30 DIAGNOSIS — O0933 Supervision of pregnancy with insufficient antenatal care, third trimester: Secondary | ICD-10-CM

## 2015-11-30 DIAGNOSIS — N760 Acute vaginitis: Secondary | ICD-10-CM | POA: Insufficient documentation

## 2015-11-30 DIAGNOSIS — Z79899 Other long term (current) drug therapy: Secondary | ICD-10-CM | POA: Insufficient documentation

## 2015-11-30 DIAGNOSIS — O26893 Other specified pregnancy related conditions, third trimester: Secondary | ICD-10-CM | POA: Insufficient documentation

## 2015-11-30 DIAGNOSIS — B373 Candidiasis of vulva and vagina: Secondary | ICD-10-CM

## 2015-11-30 DIAGNOSIS — B379 Candidiasis, unspecified: Secondary | ICD-10-CM

## 2015-11-30 LAB — URINALYSIS, ROUTINE W REFLEX MICROSCOPIC
Bilirubin Urine: NEGATIVE
GLUCOSE, UA: NEGATIVE mg/dL
Ketones, ur: NEGATIVE mg/dL
NITRITE: NEGATIVE
PH: 7.5 (ref 5.0–8.0)
Protein, ur: NEGATIVE mg/dL
Specific Gravity, Urine: 1.01 (ref 1.005–1.030)

## 2015-11-30 LAB — URINE MICROSCOPIC-ADD ON

## 2015-11-30 LAB — WET PREP, GENITAL
Clue Cells Wet Prep HPF POC: NONE SEEN
Sperm: NONE SEEN
TRICH WET PREP: NONE SEEN

## 2015-11-30 MED ORDER — FLUCONAZOLE 150 MG PO TABS: 150.0000 mg | ORAL_TABLET | Freq: Every day | ORAL | 0 refills | Status: DC

## 2015-11-30 MED ORDER — FLUCONAZOLE 150 MG PO TABS
150.0000 mg | ORAL_TABLET | Freq: Once | ORAL | Status: AC
Start: 2015-11-30 — End: 2015-11-30
  Administered 2015-11-30: 150 mg via ORAL
  Filled 2015-11-30: qty 1

## 2015-11-30 NOTE — MAU Note (Signed)
Pt reports she has been having a vaginal irritation off and on for several weeks. Has been using monostat but seems to be making it worse. . Reports a yellow/green discharge as well.

## 2015-11-30 NOTE — MAU Provider Note (Signed)
MAU HISTORY AND PHYSICAL  Chief Complaint:  Vaginitis  Lorraine Shelton is a 25 y.o.  [id number]  at 971w4d presenting for Vaginitis . Patient states she has been having  none contractions, none vaginal bleeding, intact membranes, with active fetal movement.    Has had ongoing yeast infections for past two-three months. Has been using Monistat with no relief, five courses used. Had rx for terconazole two-three months ago that helped for a few days but then the discharge returned. Reports irritation and itching. Was seen in clinic last week but did not know she had prescriptions sent to her pharmacy and never picked them up.  Past Medical History:  Diagnosis Date  . History of chlamydia 2012  . Preterm labor     Past Surgical History:  Procedure Laterality Date  . NO PAST SURGERIES      Family History  Problem Relation Age of Onset  . Asthma Brother     Social History  Substance Use Topics  . Smoking status: Never Smoker  . Smokeless tobacco: Never Used  . Alcohol use No    No Known Allergies  Prescriptions Prior to Admission  Medication Sig Dispense Refill Last Dose  . metroNIDAZOLE (FLAGYL) 500 MG tablet Take 1 tablet (500 mg total) by mouth 2 (two) times daily. 14 tablet 0   . terconazole (TERAZOL 7) 0.4 % vaginal cream Place 1 applicator vaginally at bedtime. 45 g 0     Review of Systems - Negative except for what is mentioned in HPI.  Physical Exam  Blood pressure 122/75, pulse 98, temperature 97.7 F (36.5 C), resp. rate 18, height 4\' 9"  (1.448 m), weight 68.2 kg (150 lb 6.4 oz), last menstrual period 04/16/2015, unknown if currently breastfeeding. GENERAL: Well-developed, well-nourished female in no acute distress.  LUNGS: No respiratory distress HEART: Regular rate ABDOMEN: Soft, nontender, nondistended, gravid abdomen.  EXTREMITIES: Nontender, no edema, 2+ distal pulses. GU: copious thick chunky white discharge, cervical motion tenderness present, cervix visually  closed Presentation: cephalic FHT:  Baseline 130, moderate variability, accelerations present no decelerations Contractions: none   Labs: Results for orders placed or performed during the hospital encounter of 11/30/15 (from the past 24 hour(s))  Urinalysis, Routine w reflex microscopic ([facility])   Collection Time: 11/30/15 12:00 PM  Result Value Ref Range   Color, Urine YELLOW YELLOW   APPearance CLOUDY (A) CLEAR   Specific Gravity, Urine 1.010 1.005 - 1.030   pH 7.5 5.0 - 8.0   Glucose, UA NEGATIVE NEGATIVE mg/dL   Hgb urine dipstick TRACE (A) NEGATIVE   Bilirubin Urine NEGATIVE NEGATIVE   Ketones, ur NEGATIVE NEGATIVE mg/dL   Protein, ur NEGATIVE NEGATIVE mg/dL   Nitrite NEGATIVE NEGATIVE   Leukocytes, UA LARGE (A) NEGATIVE  Urine microscopic-add on   Collection Time: 11/30/15 12:00 PM  Result Value Ref Range   Squamous Epithelial / LPF 6-30 (A) NONE SEEN   WBC, UA TOO NUMEROUS TO COUNT 0 - 5 WBC/hpf   RBC / HPF 0-5 0 - 5 RBC/hpf   Bacteria, UA MANY (A) NONE SEEN   Urine-Other AMORPHOUS URATES/PHOSPHATES     Imaging Studies:  [place] [facility] Ob Follow Up  Result Date: 11/27/2015 OBSTETRICAL ULTRASOUND: This exam was performed within a [facility]. The OB US report was generated in the [facility] system, and faxed to the ordering physician.  This report is available in the [facility] [employer] PACS. See the [facility] Obstetric US report via the Image Link.   Assessment: Lorraine  Felipa Shelton is  25 y.o. [id number] at 2477w4d presents with Vaginitis . MDM UA, wet prep, GC/CT  Plan:  Yeast infection- failed multiple trials of Monistat and 1 course of terazole One time dose diflucan given in MAU Rx given for 1 dose 150mg  Diflucan to fill if yeast infection returns Follow up as scheduled in clinic in 1 week Patient agreed with and understood plan    Tillman SersAngela C Riccio, DO PGY-1 11/9/20171:05 PM  I have seen this patient and agree with the above resident's note.  [place]IR[place],  Kitiara Hintze Certified Nurse-Midwife

## 2015-11-30 NOTE — Discharge Instructions (Signed)

## 2015-12-01 LAB — GC/CHLAMYDIA PROBE AMP (~~LOC~~) NOT AT ARMC
Chlamydia: NEGATIVE
Neisseria Gonorrhea: NEGATIVE

## 2015-12-01 NOTE — Progress Notes (Signed)
Note made to inform patient at her visit on Tuesday.

## 2015-12-05 ENCOUNTER — Ambulatory Visit (INDEPENDENT_AMBULATORY_CARE_PROVIDER_SITE_OTHER): Payer: Self-pay | Admitting: Medical

## 2015-12-05 VITALS — BP 115/61 | HR 89 | Wt 151.2 lb

## 2015-12-05 DIAGNOSIS — O09213 Supervision of pregnancy with history of pre-term labor, third trimester: Secondary | ICD-10-CM

## 2015-12-05 DIAGNOSIS — O09899 Supervision of other high risk pregnancies, unspecified trimester: Secondary | ICD-10-CM

## 2015-12-05 DIAGNOSIS — Z3482 Encounter for supervision of other normal pregnancy, second trimester: Secondary | ICD-10-CM

## 2015-12-05 DIAGNOSIS — O09219 Supervision of pregnancy with history of pre-term labor, unspecified trimester: Principal | ICD-10-CM

## 2015-12-05 NOTE — Progress Notes (Signed)
   PRENATAL VISIT NOTE  Subjective:  Lorraine Shelton is a 25 y.o. G3P1102 at 4450w2d being seen today for ongoing prenatal care.  She is currently monitored for the following issues for this high-risk pregnancy and has Heart murmur previously undiagnosed; History of preterm labor; History of preterm delivery, currently pregnant; Supervision of high risk pregnancy in second trimester; Insufficient prenatal care; and Suspected macroscopic fetus, third trimester, fetus 1 on her problem list.  Patient reports no complaints.  Contractions: Irritability. Vag. Bleeding: None.  Movement: Present. Denies leaking of fluid.   The following portions of the patient's history were reviewed and updated as appropriate: allergies, current medications, past family history, past medical history, past social history, past surgical history and problem list. Problem list updated.  Objective:   Vitals:   12/05/15 1240  BP: 115/61  Pulse: 89  Weight: 151 lb 3.2 oz (68.6 kg)    Fetal Status: Fetal Heart Rate (bpm): 132 Fundal Height: 34 cm Movement: Present      General:  Alert, oriented and cooperative. Patient is in no acute distress.  Skin: Skin is warm and dry. No rash noted.   Cardiovascular: Normal heart rate noted  Respiratory: Normal respiratory effort, no problems with respiration noted  Abdomen: Soft, gravid, appropriate for gestational age. Pain/Pressure: Absent     Pelvic:  Cervical exam deferred        Extremities: Normal range of motion.  Edema: Mild pitting, slight indentation  Mental Status: Normal mood and affect. Normal behavior. Normal judgment and thought content.   Assessment and Plan:  Pregnancy: [id number] at 6150w2d  There are no diagnoses linked to this encounter. Preterm labor symptoms and general obstetric precautions including but not limited to vaginal bleeding, contractions, leaking of fluid and fetal movement were reviewed in detail with the patient. Please refer to After Visit  Summary for other counseling recommendations.  Return in about 2 weeks (around 12/19/2015) for HR OB.   Marny LowensteinJulie N Wenzel, PA-C

## 2015-12-05 NOTE — Patient Instructions (Addendum)
Braxton Hicks Contractions Contractions of the uterus can occur throughout pregnancy. Contractions are not always a sign that you are in labor.  WHAT ARE BRAXTON HICKS CONTRACTIONS?  Contractions that occur before labor are called Braxton Hicks contractions, or false labor. Toward the end of pregnancy (32-34 weeks), these contractions can develop more often and may become more forceful. This is not true labor because these contractions do not result in opening (dilatation) and thinning of the cervix. They are sometimes difficult to tell apart from true labor because these contractions can be forceful and people have different pain tolerances. You should not feel embarrassed if you go to the hospital with false labor. Sometimes, the only way to tell if you are in true labor is for your health care provider to look for changes in the cervix. If there are no prenatal problems or other health problems associated with the pregnancy, it is completely safe to be sent home with false labor and await the onset of true labor. HOW CAN YOU TELL THE DIFFERENCE BETWEEN TRUE AND FALSE LABOR? False Labor   The contractions of false labor are usually shorter and not as hard as those of true labor.   The contractions are usually irregular.   The contractions are often felt in the front of the lower abdomen and in the groin.   The contractions may go away when you walk around or change positions while lying down.   The contractions get weaker and are shorter lasting as time goes on.   The contractions do not usually become progressively stronger, regular, and closer together as with true labor.  True Labor   Contractions in true labor last 30-70 seconds, become very regular, usually become more intense, and increase in frequency.   The contractions do not go away with walking.   The discomfort is usually felt in the top of the uterus and spreads to the lower abdomen and low back.   True labor can be  determined by your health care provider with an exam. This will show that the cervix is dilating and getting thinner.  WHAT TO REMEMBER  Keep up with your usual exercises and follow other instructions given by your health care provider.   Take medicines as directed by your health care provider.   Keep your regular prenatal appointments.   Eat and drink lightly if you think you are going into labor.   If Braxton Hicks contractions are making you uncomfortable:   Change your position from lying down or resting to walking, or from walking to resting.   Sit and rest in a tub of warm water.   Drink 2-3 glasses of water. Dehydration may cause these contractions.   Do slow and deep breathing several times an hour.  WHEN SHOULD I SEEK IMMEDIATE MEDICAL CARE? Seek immediate medical care if:  Your contractions become stronger, more regular, and closer together.   You have fluid leaking or gushing from your vagina.   You have a fever.   You pass blood-tinged mucus.   You have vaginal bleeding.   You have continuous abdominal pain.   You have low back pain that you never had before.   You feel your baby's head pushing down and causing pelvic pressure.   Your baby is not moving as much as it used to.  This information is not intended to replace advice given to you by your health care provider. Make sure you discuss any questions you have with your health care   provider. Document Released: 01/07/2005 Document Revised: 05/01/2015 Document Reviewed: 10/19/2012 Elsevier Interactive Patient Education  2017 [employer] Introduction Patient Name: ________________________________________________ Patient Due Date: ____________________ What is a fetal movement count? A fetal movement count is the number of times that you feel your baby move during a certain amount of time. This may also be called a fetal kick count. A fetal movement count is recommended for every pregnant  woman. You may be asked to start counting fetal movements as early as week 28 of your pregnancy. Pay attention to when your baby is most active. You may notice your baby's sleep and wake cycles. You may also notice things that make your baby move more. You should do a fetal movement count:  When your baby is normally most active.  At the same time each day. A good time to count movements is while you are resting, after having something to eat and drink. How do I count fetal movements? 1. Find a quiet, comfortable area. Sit, or lie down on your side. 2. Write down the date, the start time and stop time, and the number of movements that you felt between those two times. Take this information with you to your health care visits. 3. For 2 hours, count kicks, flutters, swishes, rolls, and jabs. You should feel at least 10 movements during 2 hours. 4. You may stop counting after you have felt 10 movements. 5. If you do not feel 10 movements in 2 hours, have something to eat and drink. Then, keep resting and counting for 1 hour. If you feel at least 4 movements during that hour, you may stop counting. Contact a health care provider if:  You feel fewer than 4 movements in 2 hours.  Your baby is not moving like he or she usually does. Date: ____________ Start time: ____________ Stop time: ____________ Movements: ____________ Date: ____________ Start time: ____________ Stop time: ____________ Movements: ____________ Date: ____________ Start time: ____________ Stop time: ____________ Movements: ____________ Date: ____________ Start time: ____________ Stop time: ____________ Movements: ____________ Date: ____________ Start time: ____________ Stop time: ____________ Movements: ____________ Date: ____________ Start time: ____________ Stop time: ____________ Movements: ____________ Date: ____________ Start time: ____________ Stop time: ____________ Movements: ____________ Date: ____________ Start time:  ____________ Stop time: ____________ Movements: ____________ Date: ____________ Start time: ____________ Stop time: ____________ Movements: ____________ This information is not intended to replace advice given to you by your health care provider. Make sure you discuss any questions you have with your health care provider. Document Released: 02/06/2006 Document Revised: 09/06/2015 Document Reviewed: 02/16/2015 Elsevier Interactive Patient Education  2017 Elsevier Inc.  

## 2015-12-20 ENCOUNTER — Ambulatory Visit (INDEPENDENT_AMBULATORY_CARE_PROVIDER_SITE_OTHER): Payer: Self-pay | Admitting: Obstetrics and Gynecology

## 2015-12-20 VITALS — BP 115/72 | HR 78 | Wt 154.1 lb

## 2015-12-20 DIAGNOSIS — O3663X0 Maternal care for excessive fetal growth, third trimester, not applicable or unspecified: Secondary | ICD-10-CM

## 2015-12-20 DIAGNOSIS — IMO0001 Reserved for inherently not codable concepts without codable children: Secondary | ICD-10-CM

## 2015-12-20 DIAGNOSIS — Z113 Encounter for screening for infections with a predominantly sexual mode of transmission: Secondary | ICD-10-CM

## 2015-12-20 DIAGNOSIS — Z3403 Encounter for supervision of normal first pregnancy, third trimester: Secondary | ICD-10-CM

## 2015-12-20 LAB — OB RESULTS CONSOLE GBS: GBS: POSITIVE

## 2015-12-20 NOTE — Progress Notes (Signed)
36 wk cultures today OB US scheduled for December 8th @ 1300.  Pt notified.

## 2015-12-20 NOTE — Progress Notes (Signed)
Prenatal Visit Note Date: 12/20/2015 Clinic: [facility]  Subjective:  Lorraine Shelton is a 25 y.o. G3P1102 at 3268w3d being seen today for ongoing prenatal care.  She is currently monitored for the following issues for this low-risk pregnancy and has Heart murmur previously undiagnosed; History of preterm labor; Supervision of high risk pregnancy in second trimester; Insufficient prenatal care; and Suspected macroscopic fetus, third trimester, fetus 1 on her problem list.  Patient reports no complaints.   Contractions: Irritability. Vag. Bleeding: None.  Movement: Present. Denies leaking of fluid.   The following portions of the patient's history were reviewed and updated as appropriate: allergies, current medications, past family history, past medical history, past social history, past surgical history and problem list. Problem list updated.  Objective:   Vitals:   12/20/15 1353  BP: 115/72  Pulse: 78  Weight: 154 lb 1.6 oz (69.9 kg)    Fetal Status: Fetal Heart Rate (bpm): 143 Fundal Height: 35 cm Movement: Present  Presentation: Vertex  General:  Alert, oriented and cooperative. Patient is in no acute distress.  Skin: Skin is warm and dry. No rash noted.   Cardiovascular: Normal heart rate noted  Respiratory: Normal respiratory effort, no problems with respiration noted  Abdomen: Soft, gravid, appropriate for gestational age. Pain/Pressure: Present     Pelvic:  Cervical exam deferred        Extremities: Normal range of motion.  Edema: Trace  Mental Status: Normal mood and affect. Normal behavior. Normal judgment and thought content.   Urinalysis:      Assessment and Plan:  Pregnancy: G3P1102 at 1268w3d  1. Supervision of low-risk first pregnancy, third trimester Routine care. D/w pt re: BC nv - Culture, beta strep (group b only) - GC[facility]Chlamydia [facility] amp [facility]  2. Suspected macroscopic fetus in third trimester, single or unspecified  fetus mfm recommended f/u u/s in one month so was scheduled for early December.  - US MFM OB FOLLOW UP; Future  Preterm labor symptoms and general obstetric precautions including but not limited to vaginal bleeding, contractions, leaking of fluid and fetal movement were reviewed in detail with the patient. Please refer to After Visit Summary for other counseling recommendations.  Return in about 1 week (around 12/27/2015) for 7-10d rob.   [place] Bingharlie Yandell Mcjunkins, MD

## 2015-12-21 LAB — GC/CHLAMYDIA PROBE AMP (~~LOC~~) NOT AT ARMC
Chlamydia: NEGATIVE
Neisseria Gonorrhea: NEGATIVE

## 2015-12-23 LAB — CULTURE, BETA STREP (GROUP B ONLY)

## 2015-12-25 ENCOUNTER — Encounter: Payer: Self-pay | Admitting: Obstetrics and Gynecology

## 2015-12-25 DIAGNOSIS — O99820 Streptococcus B carrier state complicating pregnancy: Secondary | ICD-10-CM | POA: Insufficient documentation

## 2015-12-29 ENCOUNTER — Ambulatory Visit (HOSPITAL_COMMUNITY): Payer: Self-pay | Attending: Obstetrics and Gynecology

## 2016-01-01 ENCOUNTER — Ambulatory Visit (INDEPENDENT_AMBULATORY_CARE_PROVIDER_SITE_OTHER): Payer: Self-pay | Admitting: Obstetrics & Gynecology

## 2016-01-01 VITALS — BP 120/52 | HR 86 | Wt 156.0 lb

## 2016-01-01 DIAGNOSIS — IMO0001 Reserved for inherently not codable concepts without codable children: Secondary | ICD-10-CM

## 2016-01-01 DIAGNOSIS — O0993 Supervision of high risk pregnancy, unspecified, third trimester: Secondary | ICD-10-CM

## 2016-01-01 DIAGNOSIS — O99820 Streptococcus B carrier state complicating pregnancy: Secondary | ICD-10-CM

## 2016-01-01 DIAGNOSIS — O3663X0 Maternal care for excessive fetal growth, third trimester, not applicable or unspecified: Secondary | ICD-10-CM

## 2016-01-01 NOTE — Patient Instructions (Signed)
Return to clinic for any scheduled appointments or obstetric concerns, or go to MAU for evaluation  

## 2016-01-01 NOTE — Progress Notes (Signed)
   PRENATAL VISIT NOTE  Subjective:  Lorraine Shelton is a 25 y.o. G3P1102 at 2074w1d being seen today for ongoing prenatal care.  She is currently monitored for the following issues for this high-risk pregnancy and has Heart murmur previously undiagnosed; History of preterm labor; Supervision of high risk pregnancy in second trimester; Insufficient prenatal care; Suspected macrosomic fetus, third trimester, fetus 1; and GBS (group B Streptococcus carrier), +RV culture, currently pregnant on her problem list.  Patient reports no complaints.  Contractions: Irregular. Vag. Bleeding: None.  Movement: Present. Denies leaking of fluid.   The following portions of the patient's history were reviewed and updated as appropriate: allergies, current medications, past family history, past medical history, past social history, past surgical history and problem list. Problem list updated.  Objective:   Vitals:   01/01/16 1546  BP: (!) 120/52  Pulse: 86  Weight: 156 lb (70.8 kg)    Fetal Status: Fetal Heart Rate (bpm): 140 Fundal Height: 41 cm Movement: Present     General:  Alert, oriented and cooperative. Patient is in no acute distress.  Skin: Skin is warm and dry. No rash noted.   Cardiovascular: Normal heart rate noted  Respiratory: Normal respiratory effort, no problems with respiration noted  Abdomen: Soft, gravid, appropriate for gestational age. Pain/Pressure: Present     Pelvic:  Cervical exam deferred        Extremities: Normal range of motion.  Edema: None  Mental Status: Normal mood and affect. Normal behavior. Normal judgment and thought content.   Results for orders placed or performed in visit on 12/20/15 (from the past 336 hour(s))  [facility]   Collection Time: 12/20/15 12:00 AM  Result Value Ref Range   Chlamydia Negative    Neisseria gonorrhea Negative   Culture, beta strep (group b only)   Collection Time: 12/20/15 12:01 AM  Result Value  Ref Range   Organism ID, Bacteria GROUP B STREP (S.[email or web address]) ISOLATED     Assessment and Plan:  Pregnancy: [id number] at 2574w1d  1. Suspected macrosomic fetus, third trimester, fetus 1 EFW 89% at 32 weeks, was scheduled for follow up at 36 weeks and she missed appointment. Rescheduled for later this week.  2. GBS (group B Streptococcus carrier), +RV culture, currently pregnant Discussed with patient. Will treat in labor.  3. Supervision of high risk pregnancy in third trimester Term labor symptoms and general obstetric precautions including but not limited to vaginal bleeding, contractions, leaking of fluid and fetal movement were reviewed in detail with the patient. Please refer to After Visit Summary for other counseling recommendations.  Return in about 1 week (around 01/08/2016) for OB Visit.   Tereso NewcomerUgonna A Irelynd Zumstein, MD

## 2016-01-05 ENCOUNTER — Inpatient Hospital Stay (HOSPITAL_COMMUNITY): Payer: Medicaid Other | Admitting: Anesthesiology

## 2016-01-05 ENCOUNTER — Ambulatory Visit (HOSPITAL_COMMUNITY): Payer: Self-pay | Attending: Obstetrics and Gynecology

## 2016-01-05 ENCOUNTER — Encounter (HOSPITAL_COMMUNITY): Payer: Self-pay

## 2016-01-05 ENCOUNTER — Inpatient Hospital Stay (HOSPITAL_COMMUNITY)
Admission: AD | Admit: 2016-01-05 | Discharge: 2016-01-06 | DRG: 775 | Disposition: A | Discharge status: Home / Self Care | Payer: Medicaid Other | Source: Ambulatory Visit | Attending: Obstetrics and Gynecology | Admitting: Obstetrics and Gynecology

## 2016-01-05 DIAGNOSIS — Z3A37 37 weeks gestation of pregnancy: Secondary | ICD-10-CM | POA: Diagnosis not present

## 2016-01-05 DIAGNOSIS — O4202 Full-term premature rupture of membranes, onset of labor within 24 hours of rupture: Secondary | ICD-10-CM | POA: Diagnosis present

## 2016-01-05 DIAGNOSIS — O99824 Streptococcus B carrier state complicating childbirth: Secondary | ICD-10-CM | POA: Diagnosis present

## 2016-01-05 DIAGNOSIS — O4290 Premature rupture of membranes, unspecified as to length of time between rupture and onset of labor, unspecified weeks of gestation: Secondary | ICD-10-CM | POA: Diagnosis present

## 2016-01-05 DIAGNOSIS — O3663X0 Maternal care for excessive fetal growth, third trimester, not applicable or unspecified: Secondary | ICD-10-CM | POA: Diagnosis present

## 2016-01-05 LAB — TYPE AND SCREEN
ABO/RH(D): O POS
Antibody Screen: NEGATIVE

## 2016-01-05 LAB — POCT FERN TEST: POCT FERN TEST: POSITIVE

## 2016-01-05 LAB — CBC
HCT: 34.1 % — ABNORMAL LOW (ref 36.0–46.0)
Hemoglobin: 11.3 g/dL — ABNORMAL LOW (ref 12.0–15.0)
MCH: 25.3 pg — ABNORMAL LOW (ref 26.0–34.0)
MCHC: 33.1 g/dL (ref 30.0–36.0)
MCV: 76.3 fL — ABNORMAL LOW (ref 78.0–100.0)
Platelets: 183 10*3/uL (ref 150–400)
RBC: 4.47 MIL/uL (ref 3.87–5.11)
RDW: 15 % (ref 11.5–15.5)
WBC: 10.3 10*3/uL (ref 4.0–10.5)

## 2016-01-05 LAB — RPR: RPR: NONREACTIVE

## 2016-01-05 MED ORDER — ZOLPIDEM TARTRATE 5 MG PO TABS: 5.0000 mg | ORAL_TABLET | Freq: Every evening | ORAL | Status: DC | PRN

## 2016-01-05 MED ORDER — BUPIVACAINE HCL (PF) 0.25 % IJ SOLN
INTRAMUSCULAR | Status: DC | PRN
  Administered 2016-01-05: 14 mL via EPIDURAL

## 2016-01-05 MED ORDER — FENTANYL CITRATE (PF) 100 MCG/2ML IJ SOLN: 50.0000 ug | INTRAMUSCULAR | Status: DC | PRN

## 2016-01-05 MED ORDER — FENTANYL 2.5 MCG/ML BUPIVACAINE 1/10 % EPIDURAL INFUSION (WH - ANES)
14.0000 mL/h | INTRAMUSCULAR | Status: DC | PRN
  Filled 2016-01-05: qty 100

## 2016-01-05 MED ORDER — ONDANSETRON HCL 4 MG/2ML IJ SOLN: 4.0000 mg | INTRAMUSCULAR | Status: DC | PRN

## 2016-01-05 MED ORDER — PENICILLIN G POTASSIUM 5000000 UNITS IJ SOLR
5.0000 10*6.[IU] | Freq: Once | INTRAVENOUS | Status: AC
  Administered 2016-01-05: 5 10*6.[IU] via INTRAVENOUS
  Filled 2016-01-05: qty 5

## 2016-01-05 MED ORDER — SOD CITRATE-CITRIC ACID 500-334 MG/5ML PO SOLN: 30.0000 mL | ORAL | Status: DC | PRN

## 2016-01-05 MED ORDER — EPHEDRINE 5 MG/ML INJ
10.0000 mg | INTRAVENOUS | Status: DC | PRN
  Filled 2016-01-05: qty 4

## 2016-01-05 MED ORDER — DIPHENHYDRAMINE HCL 25 MG PO CAPS: 25.0000 mg | ORAL_CAPSULE | Freq: Four times a day (QID) | ORAL | Status: DC | PRN

## 2016-01-05 MED ORDER — DIBUCAINE 1 % RE OINT: 1.0000 "application " | TOPICAL_OINTMENT | RECTAL | Status: DC | PRN

## 2016-01-05 MED ORDER — ONDANSETRON HCL 4 MG/2ML IJ SOLN: 4.0000 mg | Freq: Four times a day (QID) | INTRAMUSCULAR | Status: DC | PRN

## 2016-01-05 MED ORDER — OXYTOCIN BOLUS FROM INFUSION
500.0000 mL | Freq: Once | INTRAVENOUS | Status: AC
  Administered 2016-01-05: 500 mL via INTRAVENOUS

## 2016-01-05 MED ORDER — PRENATAL MULTIVITAMIN CH
1.0000 | ORAL_TABLET | Freq: Every day | ORAL | Status: DC
  Administered 2016-01-06: 1 via ORAL
  Filled 2016-01-05: qty 1

## 2016-01-05 MED ORDER — OXYCODONE HCL 5 MG PO TABS: 5.0000 mg | ORAL_TABLET | ORAL | Status: DC | PRN

## 2016-01-05 MED ORDER — FLEET ENEMA 7-19 GM/118ML RE ENEM: 1.0000 | ENEMA | RECTAL | Status: DC | PRN

## 2016-01-05 MED ORDER — ACETAMINOPHEN 325 MG PO TABS: 650.0000 mg | ORAL_TABLET | ORAL | Status: DC | PRN

## 2016-01-05 MED ORDER — LACTATED RINGERS IV SOLN: 500.0000 mL | INTRAVENOUS | Status: DC | PRN

## 2016-01-05 MED ORDER — TETANUS-DIPHTH-ACELL PERTUSSIS 5-2.5-18.5 LF-MCG/0.5 IM SUSP: 0.5000 mL | Freq: Once | INTRAMUSCULAR | Status: DC

## 2016-01-05 MED ORDER — PHENYLEPHRINE 40 MCG/ML (10ML) SYRINGE FOR IV PUSH (FOR BLOOD PRESSURE SUPPORT)
80.0000 ug | PREFILLED_SYRINGE | INTRAVENOUS | Status: DC | PRN
  Filled 2016-01-05: qty 5

## 2016-01-05 MED ORDER — ONDANSETRON HCL 4 MG PO TABS: 4.0000 mg | ORAL_TABLET | ORAL | Status: DC | PRN

## 2016-01-05 MED ORDER — WITCH HAZEL-GLYCERIN EX PADS
1.0000 | MEDICATED_PAD | CUTANEOUS | Status: DC | PRN
Start: 2016-01-05 — End: 2016-01-06
  Administered 2016-01-05: 1 via TOPICAL

## 2016-01-05 MED ORDER — PHENYLEPHRINE 40 MCG/ML (10ML) SYRINGE FOR IV PUSH (FOR BLOOD PRESSURE SUPPORT)
80.0000 ug | PREFILLED_SYRINGE | INTRAVENOUS | Status: DC | PRN
  Filled 2016-01-05: qty 10
  Filled 2016-01-05: qty 5

## 2016-01-05 MED ORDER — FENTANYL CITRATE (PF) 100 MCG/2ML IJ SOLN
50.0000 ug | INTRAMUSCULAR | Status: DC | PRN
  Administered 2016-01-05: 100 ug via INTRAVENOUS
  Filled 2016-01-05: qty 2

## 2016-01-05 MED ORDER — OXYTOCIN 40 UNITS IN LACTATED RINGERS INFUSION - SIMPLE MED
2.5000 [IU]/h | INTRAVENOUS | Status: DC
  Filled 2016-01-05: qty 1000

## 2016-01-05 MED ORDER — OXYCODONE-ACETAMINOPHEN 5-325 MG PO TABS: 1.0000 | ORAL_TABLET | ORAL | Status: DC | PRN

## 2016-01-05 MED ORDER — DIPHENHYDRAMINE HCL 50 MG/ML IJ SOLN: 12.5000 mg | INTRAMUSCULAR | Status: DC | PRN

## 2016-01-05 MED ORDER — LACTATED RINGERS IV SOLN
INTRAVENOUS | Status: DC
  Administered 2016-01-05 (×2): via INTRAVENOUS

## 2016-01-05 MED ORDER — PENICILLIN G POT IN DEXTROSE 60000 UNIT/ML IV SOLN
3.0000 10*6.[IU] | INTRAVENOUS | Status: DC
  Filled 2016-01-05 (×7): qty 50

## 2016-01-05 MED ORDER — IBUPROFEN 600 MG PO TABS
600.0000 mg | ORAL_TABLET | Freq: Four times a day (QID) | ORAL | Status: DC
  Administered 2016-01-05 – 2016-01-06 (×4): 600 mg via ORAL
  Filled 2016-01-05 (×4): qty 1

## 2016-01-05 MED ORDER — LACTATED RINGERS IV SOLN: 500.0000 mL | Freq: Once | INTRAVENOUS | Status: DC

## 2016-01-05 MED ORDER — ACETAMINOPHEN 325 MG PO TABS
650.0000 mg | ORAL_TABLET | ORAL | Status: DC | PRN
  Administered 2016-01-05 – 2016-01-06 (×2): 650 mg via ORAL
  Filled 2016-01-05 (×2): qty 2

## 2016-01-05 MED ORDER — LIDOCAINE HCL (PF) 1 % IJ SOLN
30.0000 mL | INTRAMUSCULAR | Status: DC | PRN
  Filled 2016-01-05: qty 30

## 2016-01-05 MED ORDER — BENZOCAINE-MENTHOL 20-0.5 % EX AERO
1.0000 "application " | INHALATION_SPRAY | CUTANEOUS | Status: DC | PRN
  Administered 2016-01-05: 1 via TOPICAL
  Filled 2016-01-05: qty 56

## 2016-01-05 MED ORDER — COCONUT OIL OIL: 1.0000 "application " | TOPICAL_OIL | Status: DC | PRN

## 2016-01-05 MED ORDER — SENNOSIDES-DOCUSATE SODIUM 8.6-50 MG PO TABS
2.0000 | ORAL_TABLET | ORAL | Status: DC
  Administered 2016-01-05: 2 via ORAL
  Filled 2016-01-05: qty 2

## 2016-01-05 MED ORDER — OXYCODONE-ACETAMINOPHEN 5-325 MG PO TABS: 2.0000 | ORAL_TABLET | ORAL | Status: DC | PRN

## 2016-01-05 MED ORDER — SIMETHICONE 80 MG PO CHEW: 80.0000 mg | CHEWABLE_TABLET | ORAL | Status: DC | PRN

## 2016-01-05 MED ORDER — LIDOCAINE HCL (PF) 1 % IJ SOLN
INTRAMUSCULAR | Status: DC | PRN
  Administered 2016-01-05 (×2): 5 mL via EPIDURAL

## 2016-01-05 NOTE — Progress Notes (Signed)
Pt resting comfortably with epidural. Contractions q 2- min. Category I tracing: FHR baseline: 135, +accels, no decels. Continue expectant management of NSVD.  I have seen and examined this patient and I agree with the above. Cam HaiSHAW, KIMBERLY 12:21 PM 01/05/2016

## 2016-01-05 NOTE — Anesthesia Postprocedure Evaluation (Signed)
Anesthesia Post Note  Patient: Lorraine Shelton  Procedure(s) Performed: * No procedures listed *  Patient location during evaluation: Mother Baby Anesthesia Type: Epidural Level of consciousness: awake and alert Pain management: pain level controlled Vital Signs Assessment: post-procedure vital signs reviewed and stable Respiratory status: spontaneous breathing and nonlabored ventilation Cardiovascular status: stable Postop Assessment: no headache, patient able to bend at knees, no backache, no signs of nausea or vomiting, epidural receding and adequate PO intake Anesthetic complications: no     Last Vitals:  Vitals:   01/05/16 1445 01/05/16 1645  BP: 119/62 (!) 112/45  Pulse: 94 86  Resp: 18 18  Temp: 36.9 C 36.9 C    Last Pain:  Vitals:   01/05/16 1645  TempSrc: Oral  PainSc: 2    Pain Goal:                 Hendrix Console Hristova

## 2016-01-05 NOTE — Anesthesia Procedure Notes (Signed)
Epidural Patient location during procedure: OB Start time: 01/05/2016 9:39 AM End time: 01/05/2016 9:50 AM  Staffing Anesthesiologist: Heather RobertsSINGER, Lorraine Peale Performed: anesthesiologist   Preanesthetic Checklist Completed: patient identified, site marked, pre-op evaluation, timeout performed, IV checked, risks and benefits discussed and monitors and equipment checked  Epidural Patient position: sitting Prep: DuraPrep Patient monitoring: heart rate, cardiac monitor, continuous pulse ox and blood pressure Approach: midline Location: L2-L3 Injection technique: LOR saline  Needle:  Needle type: Tuohy  Needle gauge: 17 G Needle length: 9 cm Needle insertion depth: 6 cm Catheter size: 20 Guage Catheter at skin depth: 11 cm Test dose: negative and Other  Assessment Events: blood not aspirated, injection not painful, no injection resistance and negative IV test  Additional Notes Informed consent obtained prior to proceeding including risk of failure, 1% risk of PDPH, risk of minor discomfort and bruising.  Discussed rare but serious complications including epidural abscess, permanent nerve injury, epidural hematoma.  Discussed alternatives to epidural analgesia and patient desires to proceed.  Timeout performed pre-procedure verifying patient name, procedure, and platelet count.  Patient tolerated procedure well.

## 2016-01-05 NOTE — Anesthesia Preprocedure Evaluation (Signed)

## 2016-01-05 NOTE — Lactation Note (Signed)
This note was copied from a baby's chart. Lactation Consultation Note  Patient Name: Lorraine Shelton Reason for consult: Initial assessment  Visited with 3rd time Mom, baby 5 hrs old.  Mom tried to BF with first 2 babies but "babies didn't like it".  Mom is very committed to BFing this baby.  Mom has BF twice so far, and latch score of 8 given.  Baby sleeping swaddled in crib, and Mom is resting.  Encouraged STS and cue based feedings, goal >8 feedings in 24 hrs.  Reviewed basics of BFing. Brochure given and explained about IP and OP lactation services.   Encouraged Mom to call prn, and Lactation to follow up in am.    Consult Status Consult Status: Follow-up Date: 01/06/16 Follow-up type: In-patient    Lorraine Shelton, Lorraine Shelton E Shelton, 5:28 PM

## 2016-01-05 NOTE — MAU Note (Signed)
Pt reports ROM at 0430, some contractions 

## 2016-01-05 NOTE — H&P (Signed)
Lorraine Shelton is a 25 y.o. female G3P1102 at 37w 5 d by  LMP presenting for SROM at 0430. Pt reports + fetal movement, denies vaginal bleeding, denies leaking of fluid. Pt states irregular contractions began on arrival to MAU at approximately 0530. OB History    Gravida Para Term Preterm AB Living   3 2 1 1  0 2   SAB TAB Ectopic Multiple Live Births   0 0 0 0 2     Past Medical History:  Diagnosis Date  . History of chlamydia 2012  . Preterm labor    Past Surgical History:  Procedure Laterality Date  . NO PAST SURGERIES     Family History: family history includes Asthma in her brother. Social History:  reports that she has never smoked. She has never used smokeless tobacco. She reports that she does not drink alcohol or use drugs.     Maternal Diabetes: No Genetic Screening: Normal Maternal Ultrasounds/Referrals: Normal Fetal Ultrasounds or other Referrals:  None Maternal Substance Abuse:  No Significant Maternal Medications:  None Significant Maternal Lab Results:  None Other Comments:  None  Review of Systems  HENT: Negative.   Eyes: Negative.   Cardiovascular: Negative.   Neurological: Negative.   Psychiatric/Behavioral: Negative.    Maternal Medical History:  Reason for admission: Rupture of membranes.   Contractions: Onset was 1-2 hours ago.   Frequency: irregular.   Perceived severity is mild.    Fetal activity: Perceived fetal activity is normal.   Last perceived fetal movement was within the past hour.    Prenatal complications: no prenatal complications Prenatal Complications - Diabetes: none.      Last menstrual period 04/16/2015, unknown if currently breastfeeding. Maternal Exam:  Uterine Assessment: Mild to moderate contractions q 2-5 minutes Pt breathing well through contractions     Fetal Exam Fetal Monitor Review: Mode: ultrasound.   Baseline rate: 130.  Variability: moderate (6-25 bpm).   Pattern: accelerations present and no  decelerations.    Fetal State Assessment: Category I - tracings are normal.     Physical Exam  Constitutional: She is oriented to person, place, and time. She appears well-developed and well-nourished.  Cardiovascular: Normal rate.   Respiratory: Effort normal.  GI:  Gravid  Musculoskeletal: Normal range of motion.  Neurological: She is alert and oriented to person, place, and time.  Skin: Skin is warm and dry.  Psychiatric: She has a normal mood and affect. Her behavior is normal. Thought content normal.    Prenatal labs: ABO, Rh: O/POS/-- (07/25 1411) Antibody: NEG (07/25 1411) Rubella: 1.09 (07/25 1411) RPR: NON REAC (10/31 [id number])  HBsAg: NEGATIVE (07/25 1411)  HIV: NONREACTIVE (10/31 0922)  GBS:   Positive  Assessment/Plan: IUP at 37w 5d in active labor SROM, GBS positive, PCN ordered Category I Tracing: FHT 130s, + accels, no decels  [facility] PRN, pt planning epidural  Expectant management, anticipate NSVD Girl  Breast and bottle Nexplanon

## 2016-01-05 NOTE — Progress Notes (Addendum)
   01/05/16 1440  Adult Fall Risk Assessment  Risk Factor Category (scoring not indicated) High fall risk per protocol (document High fall risk);Fall has occurred during this admission (document High fall risk)  Age 25  Fall History: Fall within 6 months prior to admission 5  Elimination; Bowel and/or Urine Incontinence 0  Elimination; Bowel and/or Urine Urgency/Frequency 0  Medications: includes PCA/Opiates, Anti-convulsants, Anti-hypertensives, Diuretics, Hypnotics, Laxatives, Sedatives, and Psychotropics 3  Patient Care Equipment 0  Mobility-Assistance 2  Mobility-Gait 2  Mobility-Sensory Deficit 0  Altered awareness of immediate physical environment 0  Impulsiveness 0  Lack of understanding of one's physical/cognitive limitations 0  Total Score 12  Patient's Fall Risk High Fall Risk (>13 points)  Adult Fall Risk Interventions  Required Bundle Interventions *See Row Information* High fall risk - low, moderate, and high requirements implemented  Pain Assessment  Pain Score 2  Pain Location Perineum  Pain Orientation Lower  Pain Descriptors / Indicators Sore  Pain Frequency Intermittent  Pain Onset With Activity  Pain Intervention(s) Emotional support  Neurological  Neuro (WDL) WDL  Musculoskeletal  Musculoskeletal (WDL) WDL  Integumentary  Integumentary (WDL) WDL   RN found pt on the floor holding infant.  Pt states she was attempting to return infant to bassinet when she slipped out of bed onto the floor holding infant.  She reports the infant remained close in her arms and did not leave her hold or hit anything.  Once on the floor she was unable to reach call light. She states she fell onto right buttocks.  Denies pain currently.  Pt states she was on the floor for approximately  20 minutes.  Md of infant and mother notified.  No new orders.  Will continue to monitor. Reviewed safety plan and patient states understanding.

## 2016-01-05 NOTE — Anesthesia Pain Management Evaluation Note (Signed)
  [facility] Pain Management Visit Note  Patient: Lorraine Shelton, 25 y.o., female  "Hello I am a [facility] of the anesthesia team at [facility]. We have an anesthesia team available at all times to provide care throughout the hospital, including epidural management and anesthesia for C-section. I don't know your plan for the delivery whether it a natural birth, water birth, IV sedation, nitrous supplementation, doula or epidural, but we want to meet your pain goals."   1.Was your pain managed to your expectations on prior hospitalizations?   Yes   2.What is your expectation for pain management during this hospitalization?     Epidural  3.How can we help you reach that goal? epidural  Record the patient's initial score and the patient's pain goal.   Pain: 6  Pain Goal: 8 [facility] wants you to be able to say your pain was always managed very well.  Lorraine Shelton 01/05/2016

## 2016-01-06 ENCOUNTER — Ambulatory Visit: Payer: Self-pay

## 2016-01-06 DIAGNOSIS — O99824 Streptococcus B carrier state complicating childbirth: Secondary | ICD-10-CM

## 2016-01-06 DIAGNOSIS — Z3A37 37 weeks gestation of pregnancy: Secondary | ICD-10-CM

## 2016-01-06 DIAGNOSIS — O4202 Full-term premature rupture of membranes, onset of labor within 24 hours of rupture: Secondary | ICD-10-CM

## 2016-01-06 MED ORDER — IBUPROFEN 600 MG PO TABS: 600.0000 mg | ORAL_TABLET | Freq: Four times a day (QID) | ORAL | 0 refills | Status: AC | PRN

## 2016-01-06 NOTE — Lactation Note (Signed)
This note was copied from a baby's chart. Lactation Consultation Note  Patient Name: Lorraine Shelton Reason for consult: Follow-up assessment Baby at 31 hr of life. Mom was requesting lactation. Baby was able to latch easily to the L breast in cross cradle. Showed mom how to pull baby's chin down to get a deeper latch and pull baby in closer. Mom is still reporting bilateral sore nipples, no skin break down or bruising noted. When baby came off, the nipple appeared normal. RN reports baby's bili has risen. Mom will bf on demand q3hr, post pump, and supplement with her milk per volume guidelines. Discussed the possibility of using a SNS with formula if she is unable to pump enough milk or if baby's condition changes. Parents are agreeable to formula "if needed".   Maternal Data    Feeding Feeding Type: Breast Fed Length of feed: 15 min  LATCH Score/Interventions Latch: Grasps breast easily, tongue down, lips flanged, rhythmical sucking. Intervention(s): Assist with latch;Breast compression  Audible Swallowing: Spontaneous and intermittent Intervention(s): Alternate breast massage  Type of Nipple: Everted at rest and after stimulation  Comfort (Breast/Nipple): Filling, red/small blisters or bruises, mild/mod discomfort  Problem noted: Mild/Moderate discomfort Interventions (Mild/moderate discomfort): Hand expression  Hold (Positioning): Assistance needed to correctly position infant at breast and maintain latch. Intervention(s): Position options  LATCH Score: 8  Lactation Tools Discussed/Used     Consult Status Consult Status: Follow-up Date: 01/07/16 Follow-up type: In-patient    Lorraine Shelton Shelton, 7:21 PM

## 2016-01-06 NOTE — Discharge Instructions (Signed)

## 2016-01-06 NOTE — Discharge Summary (Signed)
OB Discharge Summary     Patient Name: Lorraine Shelton DOB: [birth year] MRN: [id number]  Date of admission: 01/05/2016 Delivering MD: Calvert CantorWEINHOLD, SAMANTHA C   Date of discharge: 01/06/2016  Admitting diagnosis: 37WKS WATER BROKE Intrauterine pregnancy: 141w5d     Secondary diagnosis:  Active Problems:   Ruptured, membranes, premature  Additional problems: suspected macrosomia; GBS +     Discharge diagnosis: Term Pregnancy Delivered                                                                                                Post partum procedures:none  Augmentation: none  Complications: None  Hospital course:  Onset of Labor With Vaginal Delivery     25 y.o. yo [id number] at 7141w5d was admitted in Active Labor on 01/05/2016. Patient had an uncomplicated labor course as follows:  Membrane Rupture Time/Date: 4:30 AM ,01/05/2016   Intrapartum Procedures: Episiotomy: None [1]                                         Lacerations:  None [1]  Patient had a delivery of a Viable infant. 01/05/2016  Information for the patient's newborn:  Kerry HoughGarcia, Girl Markita [[id number]][[id number]]  Delivery Method: Vaginal, Spontaneous Delivery (Filed from Delivery Summary)    Pateint had an uncomplicated postpartum course.  She is ambulating, tolerating a regular diet, passing flatus, and urinating well. Patient is discharged home in stable condition on 01/06/16.    Physical exam Vitals:   01/05/16 1645 01/05/16 2105 01/06/16 0105 01/06/16 0520  BP: (!) 112/45 (!) 107/58 (!) 110/54 (!) 102/49  Pulse: 86 83 80 68  Resp: 18 18 18 18   Temp: 98.5 F (36.9 C) 98.4 F (36.9 C) 98.2 F (36.8 C) 98 F (36.7 C)  TempSrc: Oral Oral Oral Oral  SpO2:      Weight:      Height:       General: alert and cooperative Lochia: appropriate Uterine Fundus: firm Incision: N/A DVT Evaluation: No evidence of DVT seen on physical exam. Labs: Lab Results  Component Value Date   WBC 10.3 01/05/2016   HGB 11.3 (L)  01/05/2016   HCT 34.1 (L) 01/05/2016   MCV 76.3 (L) 01/05/2016   PLT 183 01/05/2016   No flowsheet data found.  Discharge instruction: per After Visit Summary and "Baby and Me Booklet".  After visit meds:  Allergies as of 01/06/2016   No Known Allergies     Medication List    TAKE these medications   ibuprofen 600 MG tablet Commonly known as:  ADVIL,MOTRIN Take 1 tablet (600 mg total) by mouth every 6 (six) hours as needed.   Prenatal Vitamins 28-0.8 MG Tabs Take 1 tablet by mouth daily.       Diet: routine diet  Activity: Advance as tolerated. Pelvic rest for 6 weeks.   Outpatient follow up:4 weeks Follow up Appt:Future Appointments Date Time Provider Department Center  01/08/2016 2:40 PM Burnett [facility]arlie Pickens, [facility]  01/16/2016 3:40 PM Samara DeistKathryn  Gena FrayLorraine Kooistra, CNM [facility]   Follow up Visit:No Follow-up on file.  Postpartum contraception: LARC  Newborn Data: Live born female  Birth Weight: 7 lb 8.8 oz (3425 g) APGAR: 9, 9  Baby Feeding: Breast Disposition:home with mother   01/06/2016 Cam HaiSHAW, KIMBERLY, CNM  8:51 AM

## 2016-01-06 NOTE — Progress Notes (Signed)
D/C education given verbalized understanding. Understands Pecola LeisureBaby is now that patient and she will still get her meals just no longer get medical care unless readmitted.

## 2016-01-06 NOTE — Lactation Note (Signed)
This note was copied from a baby's chart. Lactation Consultation Note  Patient Name: Lorraine Shelton ZOXWR'UToday's Date: 01/06/2016 Reason for consult: Follow-up assessment 37+5 Baby at 30 hr of life. Baby has 5% wt loss, 4 wets, 4 stools, and 15 feedings since birth. Mom does not think baby is getting enough at the breast because the baby is sleepy, fussy, and "eats all the time". Upon entry baby was sleeping in the basinet. Baby would not suck more than 2 times on a gloved finger. Baby did have good tongue movement with the few sucks she took. Mom was able to easily express large drops of colostrum on the L and a glistening on the R. She has not been post pumping or manual expression/spoon feeding. She is reporting bilateral sore nipples, no skin break down or bruising noted. Discussed baby behavior, feeding frequency, baby belly size, voids, wt loss, breast changes, and nipple care. She is aware of lactation services. Lactation phone on the white board for mom to call at the next feeding. She will offer the breast on demand 8+/24hr for at least 10 minutes each side, post express, and supplement with her milk per volume guidelines.     Maternal Data    Feeding Feeding Type: Breast Fed Length of feed: 10 min  LATCH Score/Interventions                      Lactation Tools Discussed/Used     Consult Status Consult Status: Follow-up Date: 01/07/16 Follow-up type: In-patient    Rulon Eisenmengerlizabeth E Barbra Miner 01/06/2016, 5:43 PM

## 2016-01-07 ENCOUNTER — Ambulatory Visit: Payer: Self-pay

## 2016-01-07 NOTE — Lactation Note (Addendum)
This note was copied from a baby's chart. Lactation Consultation Note  Patient Name: Lorraine Shelton UJWJX'BToday's Date: 01/07/2016 Reason for consult: Follow-up assessment;Other (Comment);Infant weight loss (early term baby) Re-weighed baby, now at 10.4% weight loss. Assisted Mom with positioning to obtain more depth with latch. Baby sleepy at breast, few good suckling bursts observed. Few swallowing motions but no audible swallows. Discussed with Mom the increase in weight loss and need to supplement baby till her milk comes in and weight loss stabilizes. Mom agrees. Plan discussed is Mom to BF with feeding ques, but at least every 3 hours. Try to keep baby active at breast for 15-20 minutes, both breast some feedings. Mom to supplement with EBM/formula after each feeding 15- 20 ml today, increasing per hours of age.  Mom prefers to use bottle to supplement. Mom to post pump every 3 hours to encourage milk production and to have EBM to supplement. [facility]   Maternal Data    Feeding Feeding Type: Breast Fed Length of feed: 10 min  LATCH Score/Interventions Latch: Grasps breast easily, tongue down, lips flanged, rhythmical sucking. Intervention(s): Adjust position;Assist with latch;Breast massage;Breast compression  Audible Swallowing: A few with stimulation  Type of Nipple: Everted at rest and after stimulation  Comfort (Breast/Nipple): Soft / non-tender     Hold (Positioning): Assistance needed to correctly position infant at breast and maintain latch. Intervention(s): Breastfeeding basics reviewed;Support Pillows;Position options;Skin to skin  LATCH Score: 8  Lactation Tools Discussed/Used Tools: Pump Breast pump type: Double-Electric Breast Pump   Consult Status Consult Status: Follow-up Date: 01/08/16 Follow-up type: In-patient    Alfred LevinsGranger, Shamaria Kavan Ann 01/07/2016, 1:16 PM

## 2016-01-07 NOTE — Lactation Note (Signed)
This note was copied from a baby's chart. Lactation Consultation Note  Patient Name: Girl Leander RamsJovanni Heffley ZOXWR'UToday's Date: 01/07/2016 Reason for consult: Follow-up assessment;Other (Comment) (early term baby) Baby has been to breast 10 times in past 24 hours 10-25 minutes. Mom has supplemented few times with EBM small amounts. Baby has had 3 voids/1 stool in past 24 hours. Weight loss at 8.8% with 4.3% weight loss in past 24 hours. Per [employer] Newborn graphing, weight loss > 95%. Assisted Mom with positioning and using breast compression to obtain more depth with latch at this visit. Bili level at 37 hours 9.2 which is low/intermediate zone. Lab in to draw serum bili at this visit. Discussed with Mom supplementing with feedings due to weight loss and early term status. Assisted at this feeding supplementing with 5 fr feeding tube/syringe at breast but Mom felt she would prefer bottles. Mom has lots of colostrum with hand expression. Advised Mom to post pump to see how much EBM she receives. Discussed plan of BF with each feeding, pumping for 15 minutes and supplementing till weight loss stabilizes. Mom agreeable to this plan. Will f/u with Mom for another feeding. LC left phone number for Mom to call. Peds also recommends re-weighing baby. Will do at next visit if not already done.   Maternal Data    Feeding Feeding Type: Formula Length of feed: 10 min  LATCH Score/Interventions Latch: Grasps breast easily, tongue down, lips flanged, rhythmical sucking. Intervention(s): Adjust position;Assist with latch;Breast massage;Breast compression  Audible Swallowing: A few with stimulation  Type of Nipple: Everted at rest and after stimulation  Comfort (Breast/Nipple): Filling, red/small blisters or bruises, mild/mod discomfort  Problem noted: Mild/Moderate discomfort  Hold (Positioning): Assistance needed to correctly position infant at breast and maintain latch. Intervention(s): Breastfeeding basics  reviewed;Support Pillows;Position options;Skin to skin  LATCH Score: 7  Lactation Tools Discussed/Used Tools: Pump;83F feeding tube / Syringe Breast pump type: Double-Electric Breast Pump   Consult Status Consult Status: Follow-up Date: 01/07/16 Follow-up type: In-patient    Alfred LevinsGranger, Sharry Beining Ann 01/07/2016, 10:07 AM

## 2016-01-08 ENCOUNTER — Encounter: Payer: Self-pay | Admitting: Obstetrics and Gynecology

## 2016-01-08 ENCOUNTER — Ambulatory Visit: Payer: Self-pay

## 2016-01-08 NOTE — Lactation Note (Signed)
This note was copied from a baby's chart. Lactation Consultation Note  Patient Name: Girl Leander RamsJovanni Eplin WUJWJ'XToday's Date: 01/08/2016 Reason for consult: Follow-up assessmentwith this mom and early term baby, now now 38 weeks CGA, Mom is pumping and bottle feeding mostly. I advised mom to pump 15-30 minutes now, until she stops dripping. [facility] and [facility], I will loan her a DEP prior to discharge today.    Maternal Data    Feeding Feeding Type: Breast Milk  LATCH Score/Interventions                      Lactation Tools Discussed/Used WIC Program: Yes   Consult Status Consult Status: Follow-up Date: 01/08/16 Follow-up type: In-patient    Alfred LevinsLee, Winston Misner Anne 01/08/2016, 8:56 AM

## 2016-01-16 ENCOUNTER — Encounter: Payer: Self-pay | Admitting: Student

## 2016-02-27 ENCOUNTER — Encounter: Payer: Self-pay | Admitting: *Deleted

## 2016-02-27 ENCOUNTER — Encounter: Payer: Self-pay | Admitting: Advanced Practice Midwife

## 2016-02-27 ENCOUNTER — Ambulatory Visit: Payer: Self-pay | Admitting: Advanced Practice Midwife

## 2016-02-27 NOTE — Progress Notes (Signed)
Lorraine Shelton missed a scheduled postpartum appointment. Will send letter to notify her she missed an appointment and to call to reschedule.

## 2019-05-16 ENCOUNTER — Emergency Department (HOSPITAL_COMMUNITY)
Admission: EM | Admit: 2019-05-16 | Discharge: 2019-05-16 | Disposition: A | Discharge status: Home / Self Care | Payer: Self-pay | Attending: Emergency Medicine | Admitting: Emergency Medicine

## 2019-05-16 ENCOUNTER — Other Ambulatory Visit: Payer: Self-pay

## 2019-05-16 ENCOUNTER — Encounter (HOSPITAL_COMMUNITY): Payer: Self-pay

## 2019-05-16 DIAGNOSIS — F419 Anxiety disorder, unspecified: Secondary | ICD-10-CM | POA: Insufficient documentation

## 2019-05-16 DIAGNOSIS — Z79899 Other long term (current) drug therapy: Secondary | ICD-10-CM | POA: Insufficient documentation

## 2019-05-16 DIAGNOSIS — F10920 Alcohol use, unspecified with intoxication, uncomplicated: Secondary | ICD-10-CM | POA: Insufficient documentation

## 2019-05-16 MED ORDER — ONDANSETRON 4 MG PO TBDP
4.0000 mg | ORAL_TABLET | Freq: Once | ORAL | Status: AC
  Administered 2019-05-16: 4 mg via ORAL
  Filled 2019-05-16: qty 1

## 2019-05-16 NOTE — ED Notes (Signed)
Pt reports she is feeling much better now. Pt reports she does have a ride to take her home.

## 2019-05-16 NOTE — ED Triage Notes (Signed)
Pt states that she is feeling much better. Pt reports that she is going to message her brother.

## 2019-05-16 NOTE — ED Provider Notes (Signed)
[facility] DEPT Provider Note   CSN: [id number] Arrival date & time: 05/16/19  0242     History Chief Complaint  Patient presents with  . Alcohol Intoxication    Lorraine Shelton is a 29 y.o. female.  Patient presents to the emergency department with a chief complaint of nausea and anxiousness.  She states that she had been doing some drinking last night and may have had an edible.  She states that after she got home and try to go to sleep she felt a little nauseated and may have had some palpitations and felt anxious.  She denies ever having had the symptoms before.  She states that she feels fine now.  She denies being in any pain.  She denies any other associated symptoms.  The history is provided by the patient. No language interpreter was used.       Past Medical History:  Diagnosis Date  . History of chlamydia 2012  . Preterm labor     Patient Active Problem List   Diagnosis Date Noted  . Ruptured, membranes, premature 01/05/2016  . GBS (group B Streptococcus carrier), +RV culture, currently pregnant 12/25/2015  . Suspected macrosomic fetus, third trimester, fetus 1 11/30/2015  . Insufficient prenatal care 11/21/2015  . Supervision of high risk pregnancy in third trimester 08/15/2015  . Heart murmur previously undiagnosed 09/05/2010  . History of preterm labor 09/05/2010    Past Surgical History:  Procedure Laterality Date  . NO PAST SURGERIES       OB History    Gravida  3   Para  3   Term  2   Preterm  1   AB  0   Living  3     SAB  0   TAB  0   Ectopic  0   Multiple  0   Live Births  3           Family History  Problem Relation Age of Onset  . Asthma Brother     Social History   Tobacco Use  . Smoking status: Never Smoker  . Smokeless tobacco: Never Used  Substance Use Topics  . Alcohol use: No  . Drug use: No    Home Medications Prior to Admission medications   Medication Sig Start Date  End Date Taking? Authorizing Provider  ibuprofen (ADVIL,MOTRIN) 600 MG tablet Take 1 tablet (600 mg total) by mouth every 6 (six) hours as needed. 01/06/16   Myrtis Ser, CNM  Prenatal Vit-Fe Fumarate-FA (PRENATAL VITAMINS) 28-0.8 MG TABS Take 1 tablet by mouth daily.    [provider]    Allergies    Patient has no known allergies.  Review of Systems   Review of Systems  All other systems reviewed and are negative.   Physical Exam Updated Vital Signs BP 126/74 (BP Location: Left Arm)   Pulse (!) 104   Temp 99.2 F (37.3 C) (Oral)   Resp 17   Ht 4\' 9"  (1.448 m)   Wt 70 kg   SpO2 99%   BMI 33.39 kg/m   Physical Exam Vitals and nursing note reviewed.  Constitutional:      General: She is not in acute distress.    Appearance: She is well-developed.  HENT:     Head: Normocephalic and atraumatic.  Eyes:     Conjunctiva/sclera: Conjunctivae normal.  Cardiovascular:     Rate and Rhythm: Normal rate and regular rhythm.     Heart sounds:  No murmur.  Pulmonary:     Effort: Pulmonary effort is normal. No respiratory distress.     Breath sounds: Normal breath sounds.  Abdominal:     Palpations: Abdomen is soft.     Tenderness: There is no abdominal tenderness.  Musculoskeletal:        General: Normal range of motion.     Cervical back: Neck supple.  Skin:    General: Skin is warm and dry.  Neurological:     Mental Status: She is alert and oriented to person, place, and time.  Psychiatric:        Mood and Affect: Mood normal.        Behavior: Behavior normal.     ED Results / Procedures / Treatments   Labs (all labs ordered are listed, but only abnormal results are displayed) Labs Reviewed - No data to display  EKG None  Radiology No results found.  Procedures Procedures (including critical care time)  Medications Ordered in ED Medications  ondansetron (ZOFRAN-ODT) disintegrating tablet 4 mg (4 mg Oral Given 05/16/19 0257)    ED Course  I  have reviewed the triage vital signs and the nursing notes.  Pertinent labs & imaging results that were available during my care of the patient were reviewed by me and considered in my medical decision making (see chart for details).    MDM Rules/Calculators/A&P                      Patient here after having been drinking alcohol and having edible marijuana.  She felt anxious and nauseated during the night, but feels fine now.  She states that she was concerned that she was having some sort of "an attack."  She likely did have a small anxiety attack, but she is in no acute distress now and appears stable for discharge.   Final Clinical Impression(s) / ED Diagnoses Final diagnoses:  Alcoholic intoxication without complication (HCC)  2. Anxiousness  Rx / DC Orders ED Discharge Orders    None       Roxy Horseman, PA-C 05/16/19 1101    Benjiman Core, MD 05/16/19 1451

## 2019-05-16 NOTE — ED Triage Notes (Signed)
Alcohol intoxication and edible gummies

## 2020-03-09 ENCOUNTER — Emergency Department (HOSPITAL_COMMUNITY)
Admission: EM | Admit: 2020-03-09 | Discharge: 2020-03-09 | Disposition: A | Discharge status: Home / Self Care | Payer: Self-pay | Attending: Emergency Medicine | Admitting: Emergency Medicine

## 2020-03-09 ENCOUNTER — Encounter (HOSPITAL_COMMUNITY): Payer: Self-pay | Admitting: Emergency Medicine

## 2020-03-09 ENCOUNTER — Emergency Department (HOSPITAL_COMMUNITY): Payer: Self-pay

## 2020-03-09 ENCOUNTER — Other Ambulatory Visit: Payer: Self-pay

## 2020-03-09 DIAGNOSIS — S81011A Laceration without foreign body, right knee, initial encounter: Secondary | ICD-10-CM | POA: Insufficient documentation

## 2020-03-09 DIAGNOSIS — Z23 Encounter for immunization: Secondary | ICD-10-CM | POA: Insufficient documentation

## 2020-03-09 DIAGNOSIS — W01118A Fall on same level from slipping, tripping and stumbling with subsequent striking against other sharp object, initial encounter: Secondary | ICD-10-CM | POA: Insufficient documentation

## 2020-03-09 MED ORDER — TETANUS-DIPHTH-ACELL PERTUSSIS 5-2.5-18.5 LF-MCG/0.5 IM SUSY
0.5000 mL | PREFILLED_SYRINGE | Freq: Once | INTRAMUSCULAR | Status: AC
  Administered 2020-03-09: 0.5 mL via INTRAMUSCULAR
  Filled 2020-03-09: qty 0.5

## 2020-03-09 MED ORDER — CEPHALEXIN 500 MG PO CAPS: 500.0000 mg | ORAL_CAPSULE | Freq: Three times a day (TID) | ORAL | 0 refills | Status: AC

## 2020-03-09 MED ORDER — LIDOCAINE-EPINEPHRINE 1 %-1:100000 IJ SOLN
10.0000 mL | Freq: Once | INTRAMUSCULAR | Status: AC
  Administered 2020-03-09: 10 mL
  Filled 2020-03-09: qty 2

## 2020-03-09 NOTE — ED Triage Notes (Signed)
Patient complains of approximately 5x2 cm laceration just distal to right knee that occurred when patient tripped and cut her leg on a nail sticking out of floor. Last tetanus unknown. Patient alert and oriented and in no apparent distress.

## 2020-03-09 NOTE — Progress Notes (Signed)
Orthopedic Tech Progress Note Patient Details:  Lorraine Shelton [birth year] [id number]  Ortho Devices Type of Ortho Device: Crutches,Knee Sleeve Ortho Device/Splint Location: lre Ortho Device/Splint Interventions: Ordered,Application,Adjustment   Post Interventions Patient Tolerated: Well Instructions Provided: Adjustment of device,Care of device,Poper ambulation with device   Callahan Peddie 03/09/2020, 5:30 PM

## 2020-03-09 NOTE — ED Notes (Addendum)
Pt with approx 2X5cm right lower leg laceration. Wrapped in kerlex, bleeding controled.

## 2020-03-09 NOTE — Progress Notes (Deleted)
Orthopedic Tech Progress Note Patient Details:  Lorraine Shelton 1990-11-26 [id number]          [name] 03/09/2020, 5:23 PM

## 2020-03-09 NOTE — Discharge Instructions (Addendum)
1. Medications: Tylenol or ibuprofen for pain 2. Treatment: ice for swelling, keep wound clean with warm soap and water and keep bandage dry, do not submerge in water for 24 hours.  Use the brace and crutches to prevent bending your knee and pulling at the stitches. 3. Follow Up: Please return in 10-14 days to have your stitches removed or sooner if you have concerns. Return to the emergency department for increased redness, drainage of pus from the wound   WOUND CARE  Wash wound gently with mild soap and warm water. Reapply a new bandage after cleaning wound.   Continue daily cleansing with soap and water until stitches are removed.  Do not apply any ointments or creams to the wound while stitches are in place, as this may cause delayed healing. Return if you experience any of the following signs of infection: Swelling, redness, pus drainage, streaking, fever >101.0 F  Return if you experience excessive bleeding that does not stop after 15-20 minutes of constant, firm pressure.

## 2020-03-09 NOTE — ED Provider Notes (Signed)
[facility] EMERGENCY DEPARTMENT Provider Note   CSN: [id number] Arrival date & time: 03/09/20  1135     History Chief Complaint  Patient presents with  . Extremity Laceration    Lorraine Shelton is a 30 y.o. female presenting for evaluation of right knee laceration.  Patient states just prior to arrival she slipped on some water and fell, scraping her right knee against an exposed nail.  She reports acute onset pain and bleeding.  She reports pain at the site, worse with movement and walking, but she is able to do so.  No numbness or tingling.  No injury elsewhere.  Her tetanus is not up-to-date.  She has not taken anything for pain including Tylenol ibuprofen.  She is not on immunosuppression.  She is not on blood thinners.  Pain does not radiate.  She has no medical problems, takes no medications daily.  HPI     Past Medical History:  Diagnosis Date  . History of chlamydia 2012  . Preterm labor     Patient Active Problem List   Diagnosis Date Noted  . Ruptured, membranes, premature 01/05/2016  . GBS (group B Streptococcus carrier), +RV culture, currently pregnant 12/25/2015  . Suspected macrosomic fetus, third trimester, fetus 1 11/30/2015  . Insufficient prenatal care 11/21/2015  . Supervision of high risk pregnancy in third trimester 08/15/2015  . Heart murmur previously undiagnosed 09/05/2010  . History of preterm labor 09/05/2010    Past Surgical History:  Procedure Laterality Date  . NO PAST SURGERIES       OB History    Gravida  3   Para  3   Term  2   Preterm  1   AB  0   Living  3     SAB  0   IAB  0   Ectopic  0   Multiple  0   Live Births  3           Family History  Problem Relation Age of Onset  . Asthma Brother     Social History   Tobacco Use  . Smoking status: Never Smoker  . Smokeless tobacco: Never Used  Substance Use Topics  . Alcohol use: No  . Drug use: No    Home Medications Prior to  Admission medications   Medication Sig Start Date End Date Taking? Authorizing Provider  cephALEXin (KEFLEX) 500 MG capsule Take 1 capsule (500 mg total) by mouth 3 (three) times daily for 5 days. 03/09/20 03/14/20 Yes Princessa Lesmeister, PA-C  ibuprofen (ADVIL,MOTRIN) 600 MG tablet Take 1 tablet (600 mg total) by mouth every 6 (six) hours as needed. 01/06/16   Arabella Merles, CNM  Prenatal Vit-Fe Fumarate-FA (PRENATAL VITAMINS) 28-0.8 MG TABS Take 1 tablet by mouth daily.    [provider]    Allergies    Patient has no known allergies.  Review of Systems   Review of Systems  Musculoskeletal: Positive for arthralgias.  Skin: Positive for wound.    Physical Exam Updated Vital Signs BP 120/88 (BP Location: Left Arm)   Pulse 99   Temp 98.5 F (36.9 C) (Oral)   Resp 16   SpO2 98%   Physical Exam Vitals and nursing note reviewed.  Constitutional:      General: She is not in acute distress.    Appearance: She is well-developed and well-nourished.     Comments: Sitting in the bed in NAD  HENT:     Head: Normocephalic  and atraumatic.  Eyes:     Extraocular Movements: EOM normal.  Pulmonary:     Effort: Pulmonary effort is normal.  Abdominal:     General: There is no distension.  Musculoskeletal:        General: Normal range of motion.     Cervical back: Normal range of motion.     Comments: Lac to the anterior R knee over the patellar tendon. lac~ 4 cm long, 2 mm deep. Gaping.  Patient able to flex and extend at the knee without difficulty, no sign of patellar tendon injury.  However she does have discomfort with movement of the lower leg.  Skin:    General: Skin is warm.     Capillary Refill: Capillary refill takes less than 2 seconds.     Findings: No rash.  Neurological:     Mental Status: She is alert and oriented to person, place, and time.  Psychiatric:        Mood and Affect: Mood and affect normal.     ED Results / Procedures / Treatments   Labs (all  labs ordered are listed, but only abnormal results are displayed) Labs Reviewed - No data to display  EKG None  Radiology DG Knee Complete 4 Views Right  Result Date: 03/09/2020 CLINICAL DATA:  Anterior right knee laceration EXAM: RIGHT KNEE - COMPLETE 4+ VIEW COMPARISON:  None. FINDINGS: No evidence of fracture, dislocation, or joint effusion. No evidence of arthropathy or other focal bone abnormality. Mild soft tissue irregularity in the anterior infrapatellar region, corresponding to reported laceration. No radiopaque foreign body within the soft tissues. IMPRESSION: 1. Soft tissue irregularity in the anterior infrapatellar region, corresponding to reported laceration. No radiopaque foreign body within the soft tissues. 2. No acute osseous abnormality. Electronically Signed   By: Duanne Guess D.O.   On: 03/09/2020 15:14    Procedures .Marland KitchenLaceration Repair  Date/Time: 03/09/2020 3:58 PM Performed by: Alveria Apley, PA-C Authorized by: Alveria Apley, PA-C   Consent:    Consent obtained:  Verbal   Consent given by:  Patient   Risks discussed:  Infection, need for additional repair, nerve damage, poor wound healing, poor cosmetic result and pain Anesthesia:    Anesthesia method:  Local infiltration   Local anesthetic:  Lidocaine 2% WITH epi Laceration details:    Location:  Leg   Leg location:  R knee   Length (cm):  4   Depth (mm):  3 Pre-procedure details:    Preparation:  Patient was prepped and draped in usual sterile fashion and imaging obtained to evaluate for foreign bodies Exploration:    Wound exploration: wound explored through full range of motion and entire depth of wound visualized     Wound extent: no nerve damage noted, no tendon damage noted, no underlying fracture noted and no vascular damage noted   Treatment:    Area cleansed with:  Saline   Amount of cleaning:  Standard   Irrigation solution:  Sterile water Skin repair:    Repair method:  Sutures    Suture size:  3-0   Suture material:  Prolene   Suture technique:  Horizontal mattress   Number of sutures:  4 Approximation:    Approximation:  Close Repair type:    Repair type:  Simple Post-procedure details:    Dressing:  Splint for protection   Procedure completion:  Tolerated well, no immediate complications     Medications Ordered in ED Medications  lidocaine-EPINEPHrine (XYLOCAINE W/EPI) 1 %-1:100000 (with  pres) injection 10 mL (10 mLs Infiltration Given 03/09/20 1517)  Tdap (BOOSTRIX) injection 0.5 mL (0.5 mLs Intramuscular Given 03/09/20 1518)    ED Course  I have reviewed the triage vital signs and the nursing notes.  Pertinent labs & imaging results that were available during my care of the patient were reviewed by me and considered in my medical decision making (see chart for details).    MDM Rules/Calculators/A&P                          Patient presenting for evaluation of laceration of the right knee.  On exam, patient has a mildly gaping laceration of the anterior right knee.  Mild discomfort in the knee with movement, as such, obtain x-ray as it is difficult to ascertain how deep it is.  She is neurovascularly intact.  Tetanus updated in the ED.  Xray viewed and interpreted by me, no fx, dislocation, or obvious foreign body.  Laceration repaired as described above.  Will place patient in knee sleeve and give crutches to prevent excessive bending to protect the stitches.  Discussed aftercare instructions.  At this time, patient appears safe for discharge.  Return precautions given.  Patient states he understands and agrees to plan.  Final Clinical Impression(s) / ED Diagnoses Final diagnoses:  Knee laceration, right, initial encounter    Rx / DC Orders ED Discharge Orders         Ordered    cephALEXin (KEFLEX) 500 MG capsule  3 times daily        03/09/20 1555           Cesia Orf, PA-C 03/09/20 1559    Rolan Bucco, MD 03/10/20 1540

## 2020-03-23 ENCOUNTER — Emergency Department (HOSPITAL_COMMUNITY): Admission: EM | Admit: 2020-03-23 | Discharge: 2020-03-23 | Discharge status: Against Medical Advice | Payer: Self-pay

## 2020-03-24 ENCOUNTER — Other Ambulatory Visit: Payer: Self-pay

## 2020-03-24 ENCOUNTER — Encounter (HOSPITAL_COMMUNITY): Payer: Self-pay | Admitting: Pharmacy Technician

## 2020-03-24 ENCOUNTER — Emergency Department (HOSPITAL_COMMUNITY)
Admission: EM | Admit: 2020-03-24 | Discharge: 2020-03-24 | Disposition: A | Discharge status: Home / Self Care | Payer: Self-pay | Attending: Emergency Medicine | Admitting: Emergency Medicine

## 2020-03-24 DIAGNOSIS — S81011D Laceration without foreign body, right knee, subsequent encounter: Secondary | ICD-10-CM | POA: Insufficient documentation

## 2020-03-24 DIAGNOSIS — Z4802 Encounter for removal of sutures: Secondary | ICD-10-CM | POA: Insufficient documentation

## 2020-03-24 DIAGNOSIS — X58XXXD Exposure to other specified factors, subsequent encounter: Secondary | ICD-10-CM | POA: Insufficient documentation

## 2020-03-24 NOTE — ED Triage Notes (Signed)
Pt here for suture removal

## 2020-03-24 NOTE — Discharge Instructions (Addendum)
You came to the emergency department to have your sutures removed. Wound showed no signs of infection. Your wound was healing well. There are sutures removed without complication.    To reduce scarring you may try:   Keep your scar protected from the sun. Cover the scar with sunscreen that has an SPF (sun protection factor) of 30 or higher. Do not put sunscreen on your scar until it has healed. Gently massage the scar using a circular motion. This will help to minimize the appearance of the scar. Do this only after the incision has closed and all the sutures have been removed. Remember that the scar may appear lighter or darker than your normal skin color. This difference in color should even out with time. If your scar does not fade or go away with time and you do not like how it looks, consider talking with a plastic surgeon or a dermatologist.  Get help right away if: You have a fever. You have redness that is spreading from your wound.

## 2020-03-24 NOTE — ED Provider Notes (Signed)
[facility] EMERGENCY DEPARTMENT Provider Note   CSN: [id number] Arrival date & time: 03/24/20  1601     History Chief Complaint  Patient presents with  . Suture / Staple Removal    Lorraine Shelton is a 30 y.o. female no pertinent past medical history. Presents with chief complaint of suture removal. Per chart review patient had 4 sutures placed to her right knee on 03/09/20.   Patient denies any fevers, erythema or streaking from the wound, purulent discharge from the wound, pain or swelling associated with the wound.        Past Medical History:  Diagnosis Date  . History of chlamydia 2012  . Preterm labor     Patient Active Problem List   Diagnosis Date Noted  . Ruptured, membranes, premature 01/05/2016  . GBS (group B Streptococcus carrier), +RV culture, currently pregnant 12/25/2015  . Suspected macrosomic fetus, third trimester, fetus 1 11/30/2015  . Insufficient prenatal care 11/21/2015  . Supervision of high risk pregnancy in third trimester 08/15/2015  . Heart murmur previously undiagnosed 09/05/2010  . History of preterm labor 09/05/2010    Past Surgical History:  Procedure Laterality Date  . NO PAST SURGERIES       OB History    Gravida  3   Para  3   Term  2   Preterm  1   AB  0   Living  3     SAB  0   IAB  0   Ectopic  0   Multiple  0   Live Births  3           Family History  Problem Relation Age of Onset  . Asthma Brother     Social History   Tobacco Use  . Smoking status: Never Smoker  . Smokeless tobacco: Never Used  Substance Use Topics  . Alcohol use: No  . Drug use: No    Home Medications Prior to Admission medications   Medication Sig Start Date End Date Taking? Authorizing Provider  ibuprofen (ADVIL,MOTRIN) 600 MG tablet Take 1 tablet (600 mg total) by mouth every 6 (six) hours as needed. 01/06/16   Arabella Merles, CNM  Prenatal Vit-Fe Fumarate-FA (PRENATAL VITAMINS) 28-0.8 MG TABS Take  1 tablet by mouth daily.    [provider]    Allergies    Patient has no known allergies.  Review of Systems   Review of Systems  Constitutional: Negative for chills and fever.  Musculoskeletal: Negative for arthralgias, joint swelling and myalgias.  Skin: Negative for color change and wound.  Neurological: Negative for weakness and numbness.    Physical Exam Updated Vital Signs BP 118/69 (BP Location: Right Arm)   Pulse 87   Temp 98.9 F (37.2 C) (Oral)   Resp 16   SpO2 97%   Physical Exam Vitals and nursing note reviewed.  Constitutional:      General: She is not in acute distress.    Appearance: She is not ill-appearing, toxic-appearing or diaphoretic.  HENT:     Head: Normocephalic.  Eyes:     General: No scleral icterus.       Right eye: No discharge.        Left eye: No discharge.  Cardiovascular:     Rate and Rhythm: Normal rate.  Pulmonary:     Effort: Pulmonary effort is normal.  Musculoskeletal:     Comments: Well-healed 3 cm laceration to right knee, wound is dry, intact. No  erythema, swelling, or discharge noted from or around wound.  Skin:    General: Skin is warm and dry.  Neurological:     General: No focal deficit present.     Mental Status: She is alert.  Psychiatric:        Behavior: Behavior is cooperative.     ED Results / Procedures / Treatments   Labs (all labs ordered are listed, but only abnormal results are displayed) Labs Reviewed - No data to display  EKG None  Radiology No results found.  Procedures .Suture Removal  Date/Time: 03/24/2020 9:44 PM Performed by: Haskel Schroeder, PA-C Authorized by: Haskel Schroeder, PA-C   Consent:    Consent obtained:  Verbal   Consent given by:  Patient Universal protocol:    Procedure explained and questions answered to patient or proxy's satisfaction: yes     Patient identity confirmed:  Verbally with patient and arm band Location:    Location:  Lower extremity    Lower extremity location:  Knee   Knee location:  R knee Procedure details:    Wound appearance:  No signs of infection, good wound healing, nonpurulent, clean, nontender and pink   Number of sutures removed:  4 Post-procedure details:    Post-removal:  No dressing applied   Procedure completion:  Tolerated well, no immediate complications     Medications Ordered in ED Medications - No data to display  ED Course  I have reviewed the triage vital signs and the nursing notes.  Pertinent labs & imaging results that were available during my care of the patient were reviewed by me and considered in my medical decision making (see chart for details).    MDM Rules/Calculators/A&P                          Alert 30 year old female in no acute distress, nontoxic-appearing. Patient presents with chief complaint of suture removal.    Per chart review patient had 4 sutures placed to her right knee on 03/09/20.   Patient denies any fevers, erythema or streaking from the wound, purulent discharge from the wound, pain or swelling associated with the wound.   Wound has no signs of infection, clean, nonpurulent, nontender. 4 sutures were removed without complication.    Patient given information on proper wound care and given strict return precautions. Expressed understanding of all instructions and is agreeable with this plan.  Final Clinical Impression(s) / ED Diagnoses Final diagnoses:  Visit for suture removal    Rx / DC Orders ED Discharge Orders    None       Berneice Heinrich 03/24/20 2147    Vanetta Mulders, MD 03/30/20 [phone]

## 2020-06-15 ENCOUNTER — Encounter: Payer: Self-pay | Admitting: *Deleted

## 2020-07-01 ENCOUNTER — Encounter (HOSPITAL_COMMUNITY): Payer: Self-pay | Admitting: Emergency Medicine

## 2020-07-01 ENCOUNTER — Emergency Department (HOSPITAL_COMMUNITY)
Admission: EM | Admit: 2020-07-01 | Discharge: 2020-07-02 | Disposition: A | Discharge status: Home / Self Care | Payer: Self-pay | Attending: Emergency Medicine | Admitting: Emergency Medicine

## 2020-07-01 DIAGNOSIS — N939 Abnormal uterine and vaginal bleeding, unspecified: Secondary | ICD-10-CM | POA: Insufficient documentation

## 2020-07-01 DIAGNOSIS — R519 Headache, unspecified: Secondary | ICD-10-CM | POA: Insufficient documentation

## 2020-07-01 DIAGNOSIS — R102 Pelvic and perineal pain: Secondary | ICD-10-CM

## 2020-07-01 DIAGNOSIS — N898 Other specified noninflammatory disorders of vagina: Secondary | ICD-10-CM | POA: Insufficient documentation

## 2020-07-01 LAB — URINALYSIS, ROUTINE W REFLEX MICROSCOPIC
Bacteria, UA: NONE SEEN
Bilirubin Urine: NEGATIVE
Glucose, UA: NEGATIVE mg/dL
Ketones, ur: 20 mg/dL — AB
Leukocytes,Ua: NEGATIVE
Nitrite: NEGATIVE
Protein, ur: NEGATIVE mg/dL
Specific Gravity, Urine: 1.024 (ref 1.005–1.030)
pH: 6 (ref 5.0–8.0)

## 2020-07-01 LAB — POC URINE PREG, ED: Preg Test, Ur: NEGATIVE

## 2020-07-01 NOTE — ED Provider Notes (Signed)
Emergency Medicine Provider Triage Evaluation Note  Lorraine Shelton , a 30 y.o. female  was evaluated in triage.  Pt complains of intermittent is comfort to pelvic region over years.  Has frequent UTIs.  Has some burning when she urinates.  Admits to some orange-colored vaginal discharge.  She denies any concerns for any STDs.  Denies chance of pregnancy.  No abdominal pain, emesis.  Has frequent headaches.  This has been a recurrent issue.  No blurred vision, paresthesias, weakness, traumatic injuries.  Review of Systems  Positive: Vaginal discharge, pelvic discomfort, dysuria, headaches Negative: Fever, chills, abdominal pain  Physical Exam  Ht 4\' 9"  (1.448 m)   Wt 70 kg   BMI 33.39 kg/m  Gen:   Awake, no distress   Resp:  Normal effort  MSK:   Moves extremities without difficulty  Other:    Medical Decision Making  Medically screening exam initiated at 7:24 PM.  Appropriate orders placed.  Cecila Ashmore was informed that the remainder of the evaluation will be completed by another provider, this initial triage assessment does not replace that evaluation, and the importance of remaining in the ED until their evaluation is complete.  Vaginal discharge, HA, dysuria   Avrey Hyser A, PA-C 07/01/20 08/31/20, MD 07/01/20 [phone]

## 2020-07-01 NOTE — ED Triage Notes (Signed)
Pt reports vaginal discomfort since IUD placement 4 years ago. Pt has been trying to get it improved. LMP 4 days ago.

## 2020-07-02 LAB — WET PREP, GENITAL
Clue Cells Wet Prep HPF POC: NONE SEEN
Sperm: NONE SEEN
Trich, Wet Prep: NONE SEEN
Yeast Wet Prep HPF POC: NONE SEEN

## 2020-07-02 NOTE — ED Notes (Signed)
Pt has not been brought to ED room 27

## 2020-07-02 NOTE — Discharge Instructions (Addendum)
You came to the emergency department today to be evaluated for your headache and pelvic pain.  Your urine showed no signs of urinary tract infection, your wet prep showed no signs of infection, you are not pregnant.  [facility] with [facility] for further management of your IUD and vaginal symptoms.  Your physical exam was reassuring.  Your headaches may be due to migraines.  Please follow-up with [facility] for further management of your headaches.  Get help right away if: You have sudden severe pain. Your pain gets steadily worse. You have severe pain along with fever, nausea, vomiting, or excessive sweating. You lose consciousness. Your headache becomes severe quickly. Your headache gets worse after moderate to intense physical activity. You have repeated vomiting. You have a stiff neck. You have a loss of vision. You have problems with speech. You have pain in the eye or ear. You have muscular weakness or loss of muscle control. You lose your balance or have trouble walking. You feel faint or pass out. You have confusion. You have a seizur

## 2020-07-02 NOTE — ED Provider Notes (Signed)
[facility] EMERGENCY DEPARTMENT Provider Note   CSN: [id number] Arrival date & time: 07/01/20  1900     History No chief complaint on file.   Lorraine Shelton is a 30 y.o. female with history of previous chlamydia infection.  Patient presents to the emergency department with a chief complaint of intermittent pelvic pain.  This pain has been present over the last 3 years.  Patient reports pain started 1 year after she had an IUD placed.  Patient does not have any pain at present.  Patient last had pain for 3 days prior.  Patient reports that pain is worse when she is on her menstrual period.  Patient states that she is currently on her menstrual period.  Patient denies any heavy menstrual bleeding, passing large blood clots.  Patient endorses vaginal discharge, and dysuria.  Patient reports she is sexually active in a mutually monogamous relationship with a female partner.  Patient has not had sexual encounter for 2 weeks.  Nausea, vomiting, abdominal pain.  Patient also complains of headaches.  Patient reports that headaches have been intermittent over the last 3 years.  Patient does not have headache at this time.  Patient last had headache 1 day prior.  Patient reports that headache onset gradual and headache pain progressively worsens.  Patient endorses photophobia and aggravation of symptoms with loud noises.  Patient reports minimal relief with over-the-counter pain medication.  Patient denies any associated visual disturbance, facial asymmetry, slurred speech, numbness, weakness, neck pain, neck stiffness, back pain.  Patient has not had any recent falls or injuries.  Has no history of abdominal surgeries.  G4P2-1-1-3  HPI     Past Medical History:  Diagnosis Date   History of chlamydia 2012   Preterm labor     Patient Active Problem List   Diagnosis Date Noted   Ruptured, membranes, premature 01/05/2016   GBS (group B Streptococcus carrier), +RV culture, currently  pregnant 12/25/2015   Suspected macrosomic fetus, third trimester, fetus 1 11/30/2015   Insufficient prenatal care 11/21/2015   Supervision of high risk pregnancy in third trimester 08/15/2015   Heart murmur previously undiagnosed 09/05/2010   History of preterm labor 09/05/2010    Past Surgical History:  Procedure Laterality Date   NO PAST SURGERIES       OB History     Gravida  3   Para  3   Term  2   Preterm  1   AB  0   Living  3      SAB  0   IAB  0   Ectopic  0   Multiple  0   Live Births  3           Family History  Problem Relation Age of Onset   Asthma Brother     Social History   Tobacco Use   Smoking status: Never   Smokeless tobacco: Never  Substance Use Topics   Alcohol use: No   Drug use: No    Home Medications Prior to Admission medications   Medication Sig Start Date End Date Taking? Authorizing Provider  ibuprofen (ADVIL,MOTRIN) 600 MG tablet Take 1 tablet (600 mg total) by mouth every 6 (six) hours as needed. 01/06/16   Arabella Merles, CNM  Prenatal Vit-Fe Fumarate-FA (PRENATAL VITAMINS) 28-0.8 MG TABS Take 1 tablet by mouth daily.    [provider]    Allergies    Patient has no known allergies.  Review of Systems  Review of Systems  Constitutional:  Negative for chills and fever.  Eyes:  Negative for visual disturbance.  Respiratory:  Negative for shortness of breath.   Cardiovascular:  Negative for chest pain.  Gastrointestinal:  Negative for abdominal distention, abdominal pain, nausea and vomiting.  Genitourinary:  Positive for dysuria, pelvic pain, vaginal bleeding (on LMP) and vaginal discharge. Negative for decreased urine volume, difficulty urinating, flank pain, genital sores, hematuria, menstrual problem and vaginal pain.  Musculoskeletal:  Negative for back pain, neck pain and neck stiffness.  Skin:  Negative for color change, rash and wound.  Neurological:  Positive for headaches. Negative for  dizziness, tremors, seizures, syncope, facial asymmetry, speech difficulty, weakness, light-headedness and numbness.  Psychiatric/Behavioral:  Negative for confusion.    Physical Exam Updated Vital Signs BP 116/65 (BP Location: Left Arm)   Pulse 71   Temp 98 F (36.7 C)   Resp 16   Ht 4\' 9"  (1.448 m)   Wt 70 kg   SpO2 99%   BMI 33.39 kg/m   Physical Exam Vitals and nursing note reviewed. Exam conducted with a chaperone present (Female nurse tech present as chaperone).  Constitutional:      General: She is not in acute distress.    Appearance: She is not ill-appearing, toxic-appearing or diaphoretic.  HENT:     Head: Normocephalic and atraumatic. No raccoon eyes, Battle's sign, abrasion, contusion, masses, right periorbital erythema, left periorbital erythema or laceration.     Jaw: No trismus or pain on movement.     Mouth/Throat:     Pharynx: Oropharynx is clear. Uvula midline. No pharyngeal swelling, oropharyngeal exudate, posterior oropharyngeal erythema or uvula swelling.  Eyes:     General: No scleral icterus.       Right eye: No discharge.        Left eye: No discharge.     Extraocular Movements: Extraocular movements intact.     Pupils: Pupils are equal, round, and reactive to light.  Cardiovascular:     Rate and Rhythm: Normal rate.  Pulmonary:     Effort: Pulmonary effort is normal. No respiratory distress.  Abdominal:     General: Abdomen is flat. Bowel sounds are normal. There is no distension. There are no signs of injury.     Palpations: Abdomen is soft. There is no mass or pulsatile mass.     Tenderness: There is no abdominal tenderness. There is no guarding or rebound.     Hernia: There is no hernia in the umbilical area, ventral area, left inguinal area or right inguinal area.  Genitourinary:    Exam position: Lithotomy position.     Pubic Area: No rash.      Tanner stage (genital): 5.     Labia:        Right: No rash, tenderness, lesion or injury.         Left: No rash, tenderness, lesion or injury.      Urethra: No prolapse.     Vagina: No signs of injury and foreign body. Vaginal discharge present. No erythema, tenderness, bleeding, lesions or prolapsed vaginal walls.     Cervix: Discharge (minimal), erythema and cervical bleeding (minimal) present. No cervical motion tenderness, friability, lesion or eversion.     Uterus: Not enlarged and not tender.      Adnexa: Right adnexa normal and left adnexa normal.     Comments: Patient has minimal erythema to 6 o'clock position of cervix, clear to green mucus noted in vaginal vault  and from cervical os.  Minimal amount of bleeding from cervical os.    IUD strings visualized Musculoskeletal:     Cervical back: Normal range of motion and neck supple. No edema, erythema, signs of trauma, rigidity, torticollis or crepitus. No pain with movement, spinous process tenderness or muscular tenderness. Normal range of motion.     Comments: No midline tenderness or deformity to cervical, thoracic, or lumbar spine  Lymphadenopathy:     Lower Body: No right inguinal adenopathy. No left inguinal adenopathy.  Skin:    General: Skin is warm and dry.  Neurological:     General: No focal deficit present.     Mental Status: She is alert and oriented to person, place, and time.     GCS: GCS eye subscore is 4. GCS verbal subscore is 5. GCS motor subscore is 6.     Cranial Nerves: No facial asymmetry.     Sensory: Sensation is intact.     Motor: No weakness, tremor or seizure activity.     Coordination: Romberg sign negative. Finger-Nose-Finger Test normal.     Gait: Gait is intact. Gait normal.     Comments: CN II-XII intact, equal grip strength, +5 strength to bilateral upper and lower extremities, sensation to light touch intact to bilateral upper and lower extremities  Psychiatric:        Behavior: Behavior is cooperative.    ED Results / Procedures / Treatments   Labs (all labs ordered are listed, but only  abnormal results are displayed) Labs Reviewed  WET PREP, GENITAL - Abnormal; Notable for the following components:      Result Value   WBC, Wet Prep HPF POC MANY (*)    All other components within normal limits  URINALYSIS, ROUTINE W REFLEX MICROSCOPIC - Abnormal; Notable for the following components:   APPearance HAZY (*)    Hgb urine dipstick SMALL (*)    Ketones, ur 20 (*)    All other components within normal limits  POC URINE PREG, ED  GC/[facility]LAM[facility]DIA PROBE AMP ([place]    EKG None  Radiology No results found.  Procedures Procedures   Medications Ordered in ED Medications - No data to display  ED Course  I have reviewed the triage vital signs and the nursing notes.  Pertinent labs & imaging results that were available during my care of the patient were reviewed by me and considered in my medical decision making (see chart for details).    MDM Rules/Calculators/A&P                          Alert 30 year old female no acute distress, nontoxic-appearing.  Patient presents emerged department with a chief complaint of intermittent pelvic pain over the last 3 years.  Patient has no pain at present.  Patient last had pain 3 days ago.  Patient expresses concern due to her IUD.  Patient is requesting IUD be removed.  Patient endorses dysuria and vaginal discharge.  Patient also complains of headache intermittently over the last 3 years.  Patient has no headache at present.  Patient last had headache 1 day prior.  Headache onset is gradual and pain progressively worse.  Patient endorses associated photophobia and exacerbation of pain with loud noises.  Patient denies any focal neurological deficits associated with her headaches.  On physical exam patient has no focal neurological deficits.  Low suspicion for subarachnoid hemorrhage at this time.  Possible migraine due  to patient's constellation of symptoms and chronicity.  While patient follow-up with [place] [facility] for further management.  On physical exam abdomen soft, nondistended, nontender.  No guarding, rebound tenderness.  Minimal vaginal bleeding and discharge noted on pelvic exam.  Patient has no tenderness to uterus or bilateral adnexa.  IUD strings noted from cervical os.  No CMT.  Patient's vaginal bleeding likely associated with her current menstrual period.    Shared decision making with patient about testing for syphilis and HIV as well as empiric treatment for gonorrhea chlamydia.  Patient defers treatment or further testing at this time.  Patient was given information to follow-up with [facility] department, urgent care, or return to emergency department if she is contacted with positive gonorrhea chlamydia test results.  Patient was given information to follow-up with [facility] department or urgent care if she desired syphilis or HIV testing in the future.  Pregnancy test negative. Urinalysis shows no signs of infection. Wet prep shows no signs of trichomonas, yeast infection, or bacterial vaginosis. Will have patient follow-up with OB/GYN for further evaluation of her pelvic pain and IUD removal.  Discussed results, findings, treatment and follow up. Patient advised of return precautions. Patient verbalized understanding and agreed with plan.   Final Clinical Impression(s) / ED Diagnoses Final diagnoses:  Acute nonintractable headache, unspecified headache type  Pelvic pain    Rx / DC Orders ED Discharge Orders     None        Haskel Schroeder, PA-C 07/02/20 2112    Rozelle Logan, DO 07/03/20 [id number]

## 2020-07-02 NOTE — ED Notes (Signed)
Received pt to rm 27, no distress, states no pain currently that it comes and goes and that currently she is only vaginally spotting and not sure when the last time was that she has passed clots vaginally, states she has been having problems for the past 3 to 4 years - every since she had the IUD placed, when asked why she decided to come in today, she states that she is just tired of feeling bad and that she knows that is related to the IUD

## 2020-07-03 LAB — GC/CHLAMYDIA PROBE AMP (~~LOC~~) NOT AT ARMC
Chlamydia: POSITIVE — AB
Comment: NEGATIVE
Comment: NORMAL
Neisseria Gonorrhea: NEGATIVE

## 2020-07-05 ENCOUNTER — Telehealth: Payer: Self-pay | Admitting: Advanced Practice Midwife

## 2020-07-05 DIAGNOSIS — A749 Chlamydial infection, unspecified: Secondary | ICD-10-CM

## 2020-07-05 MED ORDER — AZITHROMYCIN 500 MG PO TABS: 1000.0000 mg | ORAL_TABLET | Freq: Once | ORAL | 1 refills | Status: AC

## 2020-07-05 NOTE — Telephone Encounter (Addendum)
Lorraine Shelton tested positive for  Chlamydia. Patient was called by RN and allergies and pharmacy confirmed. Rx sent to pharmacy of choice.   [name], [place] 07/05/2020 2:00 PM       ----- Message from Kathe Becton, RN sent at 07/05/2020  1:51 PM EDT ----- This patient tested positive for Chlamydia:  She has NKDA, I have informed the patient of her results and confirmed her pharmacy is correct in her chart. Please send Rx.   Thank you,  Kathe Becton, RN   Results faxed to [facility] Department.

## 2022-09-12 ENCOUNTER — Encounter (HOSPITAL_COMMUNITY): Payer: Self-pay | Admitting: Emergency Medicine

## 2022-09-12 ENCOUNTER — Other Ambulatory Visit: Payer: Self-pay

## 2022-09-12 ENCOUNTER — Emergency Department (HOSPITAL_COMMUNITY)
Admission: EM | Admit: 2022-09-12 | Discharge: 2022-09-13 | Disposition: A | Discharge status: Home / Self Care | Payer: Self-pay | Attending: Emergency Medicine | Admitting: Emergency Medicine

## 2022-09-12 DIAGNOSIS — F419 Anxiety disorder, unspecified: Secondary | ICD-10-CM | POA: Insufficient documentation
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
## 2022-09-13 MED ORDER — HYDROXYZINE HCL 25 MG PO TABS: 25.0000 mg | ORAL_TABLET | Freq: Four times a day (QID) | ORAL | 0 refills | Status: AC | PRN

## 2022-09-13 NOTE — Discharge Instructions (Addendum)
You may use the provided medication for moments when you are feeling most anxious.  Please follow-up with your therapist as discussed.  You may also present to the below listed behavioral health urgent care should you feel you are in crisis.  Return to the ER with any new severe symptoms.

## 2023-02-19 ENCOUNTER — Emergency Department (HOSPITAL_COMMUNITY)
Admission: EM | Admit: 2023-02-19 | Discharge: 2023-02-20 | Disposition: A | Discharge status: Home / Self Care | Payer: Self-pay | Attending: Emergency Medicine | Admitting: Emergency Medicine

## 2023-02-19 ENCOUNTER — Encounter (HOSPITAL_COMMUNITY): Payer: Self-pay | Admitting: *Deleted

## 2023-02-19 ENCOUNTER — Other Ambulatory Visit: Payer: Self-pay

## 2023-02-19 ENCOUNTER — Emergency Department (HOSPITAL_COMMUNITY): Payer: Self-pay

## 2023-02-19 DIAGNOSIS — Z20822 Contact with and (suspected) exposure to covid-19: Secondary | ICD-10-CM | POA: Insufficient documentation

## 2023-02-19 DIAGNOSIS — J40 Bronchitis, not specified as acute or chronic: Secondary | ICD-10-CM | POA: Insufficient documentation

## 2023-02-19 NOTE — ED Triage Notes (Signed)
Cough for 3-4 days no temp she is also anxious  she is unable to work or sleep  lmp jan 5th bc

## 2023-02-20 LAB — RESP PANEL BY RT-PCR (RSV, FLU A&B, COVID)  RVPGX2
Influenza A by PCR: NEGATIVE
Influenza B by PCR: NEGATIVE
Resp Syncytial Virus by PCR: NEGATIVE
SARS Coronavirus 2 by RT PCR: NEGATIVE

## 2023-02-20 MED ORDER — DEXAMETHASONE 4 MG PO TABS
10.0000 mg | ORAL_TABLET | Freq: Once | ORAL | Status: AC
  Administered 2023-02-20: 10 mg via ORAL
  Filled 2023-02-20: qty 3

## 2023-02-20 MED ORDER — AZITHROMYCIN 250 MG PO TABS: 250.0000 mg | ORAL_TABLET | Freq: Every day | ORAL | 0 refills | Status: AC

## 2023-02-20 NOTE — Discharge Instructions (Signed)
Start antibiotics and get home.  I have treated you with a long-acting steroid.  Return if symptoms worsen.

## 2023-02-20 NOTE — ED Notes (Signed)
This RN reviewed discharge instructions with patient. She verbalized understanding and denied any further questions. PT well appearing upon discharge and reports no pain. Pt ambulated with stable gait to exit. Pt endorses ride home.

## 2023-02-20 NOTE — ED Provider Notes (Signed)
[place] EMERGENCY DEPARTMENT AT [facility] Provider Note   CSN: [id number] Arrival date & time: 02/19/23  2221     History  Chief Complaint  Patient presents with   Cough    Lorraine Shelton is a 33 y.o. female.  Patient here with cough for the last couple days.  Nothing makes it worse or better.  Sick symptoms with family member at home as well.  Had fever earlier but now resolved.  Denies any chest pain shortness of breath.  No recent surgery or travel.  She has Mirena IUD.  She is having some production with her cough.  She denies any nausea vomiting diarrhea abdominal pain.  The history is provided by the patient.       Home Medications Prior to Admission medications   Medication Sig Start Date End Date Taking? Authorizing Provider  azithromycin (ZITHROMAX) 250 MG tablet Take 1 tablet (250 mg total) by mouth daily. Take first 2 tablets together, then 1 every day until finished. 02/20/23  Yes Vergia Chea, DO  acetaminophen (TYLENOL) 500 MG tablet Take 500 mg by mouth every 6 (six) hours as needed for moderate pain or mild pain.    [provider]  hydrOXYzine (ATARAX) 25 MG tablet Take 1 tablet (25 mg total) by mouth every 6 (six) hours as needed for anxiety. 09/13/22   Sponseller, Eugene Gavia, PA-C  ibuprofen (ADVIL,MOTRIN) 600 MG tablet Take 1 tablet (600 mg total) by mouth every 6 (six) hours as needed. Patient not taking: Reported on 07/02/2020 01/06/16   Arabella Merles, CNM  levonorgestrel (MIRENA, 52 MG,) 20 MCG/DAY IUD 1 each by Intrauterine route once.    [provider]      Allergies    Patient has no known allergies.    Review of Systems   Review of Systems  Physical Exam Updated Vital Signs BP (!) 152/91 (BP Location: Right Arm)   Pulse 86   Temp 98 F (36.7 C)   Resp 16   Ht 4\' 9"  (1.448 m)   Wt 70 kg   LMP 01/27/2023   SpO2 99%   BMI 33.39 kg/m  Physical Exam Vitals and nursing note reviewed.  Constitutional:       General: She is not in acute distress.    Appearance: She is well-developed. She is not ill-appearing.  HENT:     Head: Normocephalic and atraumatic.     Nose: Nose normal.     Mouth/Throat:     Mouth: Mucous membranes are moist.  Eyes:     Extraocular Movements: Extraocular movements intact.     Conjunctiva/sclera: Conjunctivae normal.     Pupils: Pupils are equal, round, and reactive to light.  Cardiovascular:     Rate and Rhythm: Normal rate and regular rhythm.     Pulses: Normal pulses.     Heart sounds: Normal heart sounds. No murmur heard. Pulmonary:     Effort: Pulmonary effort is normal. No respiratory distress.     Breath sounds: Normal breath sounds.  Abdominal:     Palpations: Abdomen is soft.     Tenderness: There is no abdominal tenderness.  Musculoskeletal:        General: No swelling.     Cervical back: Normal range of motion and neck supple.  Skin:    General: Skin is warm and dry.     Capillary Refill: Capillary refill takes less than 2 seconds.  Neurological:     General: No focal deficit present.  Mental Status: She is alert.  Psychiatric:        Mood and Affect: Mood normal.     ED Results / Procedures / Treatments   Labs (all labs ordered are listed, but only abnormal results are displayed) Labs Reviewed  RESP PANEL BY RT-PCR (RSV, FLU A&B, COVID)  [id number]    EKG None  Radiology DG Chest 2 View Result Date: 02/19/2023 CLINICAL DATA:  Cough for 3-4 days, congestion. EXAM: CHEST - 2 VIEW COMPARISON:  None Available. FINDINGS: The cardiomediastinal contours are normal. Peribronchial thickening with minimal ill-defined opacity in the bases. Pulmonary vasculature is normal. No pleural effusion or pneumothorax. No acute osseous abnormalities are seen. IMPRESSION: Peribronchial thickening with minimal ill-defined opacity in the lung bases atelectasis versus pneumonia. Electronically Signed   By: Narda Rutherford M.D.   On: 02/19/2023 23:14     Procedures Procedures    Medications Ordered in ED Medications  dexamethasone (DECADRON) tablet 10 mg (has no administration in time range)    ED Course/ Medical Decision Making/ A&P                                 Medical Decision Making Risk Prescription drug management.   Lorraine Shelton is here with cough.  Normal vitals.  No fever.  Well-appearing.  Differential diagnosis viral process/bronchitis/pneumonia.  I have no concern for ACS or PE.  She is PERC negative.  Not having any chest pain.  She has no history of asthma or reactive airway disease.  Chest x-ray per radiology report shows no possible bronchitis/pneumonia.  COVID flu RSV test negative.  Overall we will treat with Decadron and Z-Pak.  Discharged in good condition.  Understands return precautions.  This chart was dictated using voice recognition software.  Despite best efforts to proofread,  errors can occur which can change the documentation meaning.         Final Clinical Impression(s) / ED Diagnoses Final diagnoses:  Bronchitis    Rx / DC Orders ED Discharge Orders          Ordered    azithromycin (ZITHROMAX) 250 MG tablet  Daily        02/20/23 0727              Virgina Norfolk, DO 02/20/23 0730
